# Patient Record
Sex: Female | Born: 1945 | Race: White | Hispanic: No | Marital: Married | Smoking: Never smoker
Health system: Southern US, Community
[De-identification: ages and names within clinical notes are randomized; demographics above are authoritative.]

## PROBLEM LIST (undated history)

## (undated) DIAGNOSIS — K219 Gastro-esophageal reflux disease without esophagitis: Secondary | ICD-10-CM

## (undated) DIAGNOSIS — F419 Anxiety disorder, unspecified: Secondary | ICD-10-CM

## (undated) DIAGNOSIS — E079 Disorder of thyroid, unspecified: Secondary | ICD-10-CM

## (undated) HISTORY — PX: ABDOMINAL HYSTERECTOMY: SHX81

## (undated) HISTORY — PX: DILATION AND CURETTAGE OF UTERUS: SHX78

## (undated) HISTORY — DX: Disorder of thyroid, unspecified: E07.9

## (undated) HISTORY — PX: BLADDER REPAIR: SHX76

---

## 2006-11-08 ENCOUNTER — Ambulatory Visit (HOSPITAL_COMMUNITY): Admission: RE | Admit: 2006-11-08 | Discharge: 2006-11-08 | Payer: Self-pay | Admitting: Family Medicine

## 2006-11-09 ENCOUNTER — Ambulatory Visit (HOSPITAL_COMMUNITY): Admission: RE | Admit: 2006-11-09 | Discharge: 2006-11-09 | Payer: Self-pay | Admitting: Family Medicine

## 2007-01-24 ENCOUNTER — Ambulatory Visit: Payer: Self-pay | Admitting: Orthopedic Surgery

## 2008-04-20 ENCOUNTER — Emergency Department (HOSPITAL_COMMUNITY): Admission: EM | Admit: 2008-04-20 | Discharge: 2008-04-20 | Payer: Self-pay | Admitting: Emergency Medicine

## 2008-04-30 ENCOUNTER — Ambulatory Visit: Payer: Self-pay | Admitting: Cardiology

## 2008-05-01 ENCOUNTER — Ambulatory Visit: Payer: Self-pay | Admitting: Cardiovascular Disease

## 2008-05-01 ENCOUNTER — Ambulatory Visit (HOSPITAL_COMMUNITY): Admission: RE | Admit: 2008-05-01 | Discharge: 2008-05-01 | Payer: Self-pay | Admitting: Cardiovascular Disease

## 2008-05-04 ENCOUNTER — Ambulatory Visit: Payer: Self-pay | Admitting: Orthopedic Surgery

## 2008-05-04 DIAGNOSIS — M7580 Other shoulder lesions, unspecified shoulder: Secondary | ICD-10-CM

## 2008-05-04 DIAGNOSIS — M25519 Pain in unspecified shoulder: Secondary | ICD-10-CM | POA: Insufficient documentation

## 2008-05-04 DIAGNOSIS — M25819 Other specified joint disorders, unspecified shoulder: Secondary | ICD-10-CM | POA: Insufficient documentation

## 2008-05-14 ENCOUNTER — Ambulatory Visit: Payer: Self-pay | Admitting: Cardiology

## 2008-06-16 ENCOUNTER — Telehealth: Payer: Self-pay | Admitting: Orthopedic Surgery

## 2008-06-24 ENCOUNTER — Ambulatory Visit: Payer: Self-pay | Admitting: Orthopedic Surgery

## 2008-06-24 DIAGNOSIS — M542 Cervicalgia: Secondary | ICD-10-CM | POA: Insufficient documentation

## 2008-06-26 ENCOUNTER — Encounter (INDEPENDENT_AMBULATORY_CARE_PROVIDER_SITE_OTHER): Payer: Self-pay | Admitting: *Deleted

## 2008-07-02 ENCOUNTER — Telehealth: Payer: Self-pay | Admitting: Orthopedic Surgery

## 2008-07-03 ENCOUNTER — Ambulatory Visit (HOSPITAL_COMMUNITY): Admission: RE | Admit: 2008-07-03 | Discharge: 2008-07-03 | Payer: Self-pay | Admitting: Orthopedic Surgery

## 2008-07-07 ENCOUNTER — Encounter (HOSPITAL_COMMUNITY): Admission: RE | Admit: 2008-07-07 | Discharge: 2008-08-06 | Payer: Self-pay | Admitting: Orthopedic Surgery

## 2008-07-08 ENCOUNTER — Ambulatory Visit: Payer: Self-pay | Admitting: Orthopedic Surgery

## 2008-07-21 ENCOUNTER — Encounter: Payer: Self-pay | Admitting: Orthopedic Surgery

## 2008-07-31 ENCOUNTER — Telehealth: Payer: Self-pay | Admitting: Orthopedic Surgery

## 2008-08-11 ENCOUNTER — Telehealth: Payer: Self-pay | Admitting: Orthopedic Surgery

## 2008-08-26 ENCOUNTER — Encounter: Payer: Self-pay | Admitting: Orthopedic Surgery

## 2008-09-17 ENCOUNTER — Encounter: Payer: Self-pay | Admitting: Orthopedic Surgery

## 2008-12-15 ENCOUNTER — Encounter: Payer: Self-pay | Admitting: Obstetrics and Gynecology

## 2008-12-15 ENCOUNTER — Inpatient Hospital Stay (HOSPITAL_COMMUNITY): Admission: RE | Admit: 2008-12-15 | Discharge: 2008-12-16 | Payer: Self-pay | Admitting: Obstetrics and Gynecology

## 2009-04-16 ENCOUNTER — Ambulatory Visit (HOSPITAL_COMMUNITY): Admission: RE | Admit: 2009-04-16 | Discharge: 2009-04-16 | Payer: Self-pay | Admitting: Family Medicine

## 2009-08-04 ENCOUNTER — Ambulatory Visit (HOSPITAL_COMMUNITY): Admission: RE | Admit: 2009-08-04 | Discharge: 2009-08-04 | Payer: Self-pay | Admitting: Family Medicine

## 2009-10-19 ENCOUNTER — Ambulatory Visit (HOSPITAL_COMMUNITY)
Admission: RE | Admit: 2009-10-19 | Discharge: 2009-10-19 | Payer: Self-pay | Source: Home / Self Care | Admitting: Family Medicine

## 2009-10-27 ENCOUNTER — Ambulatory Visit (HOSPITAL_COMMUNITY)
Admission: RE | Admit: 2009-10-27 | Discharge: 2009-10-27 | Payer: Self-pay | Source: Home / Self Care | Admitting: Family Medicine

## 2010-08-03 ENCOUNTER — Ambulatory Visit (HOSPITAL_COMMUNITY)
Admission: RE | Admit: 2010-08-03 | Discharge: 2010-08-03 | Payer: Self-pay | Source: Home / Self Care | Attending: Internal Medicine | Admitting: Internal Medicine

## 2010-08-03 ENCOUNTER — Ambulatory Visit: Payer: Self-pay | Admitting: Internal Medicine

## 2010-09-04 ENCOUNTER — Encounter: Payer: Self-pay | Admitting: Family Medicine

## 2010-11-22 LAB — BASIC METABOLIC PANEL
BUN: 7 mg/dL (ref 6–23)
CO2: 25 mEq/L (ref 19–32)
CO2: 29 mEq/L (ref 19–32)
Calcium: 8.6 mg/dL (ref 8.4–10.5)
Calcium: 8.6 mg/dL (ref 8.4–10.5)
Creatinine, Ser: 0.67 mg/dL (ref 0.4–1.2)
GFR calc Af Amer: >60 mL/min (ref >60)
GFR calc non Af Amer: >60 mL/min (ref >60)
Glucose, Bld: 112 mg/dL — ABNORMAL HIGH (ref 70–99)
Glucose, Bld: 159 mg/dL — ABNORMAL HIGH (ref 70–99)
Potassium: 4.2 mEq/L (ref 3.5–5.1)

## 2010-11-22 LAB — TYPE AND SCREEN: ABO/RH(D): A POS

## 2010-11-22 LAB — CBC
Hemoglobin: 11.2 g/dL — ABNORMAL LOW (ref 12.0–15.0)
MCV: 88 fL (ref 78.0–100.0)
Platelets: 202 10*3/uL (ref 150–400)
Platelets: 258 10*3/uL (ref 150–400)
RDW: 13.1 % (ref 11.5–15.5)
WBC: 10.5 10*3/uL (ref 4.0–10.5)
WBC: 8.7 10*3/uL (ref 4.0–10.5)

## 2010-11-22 LAB — PROTIME-INR
INR: 1 (ref 0.00–1.49)
Prothrombin Time: 12.8 seconds (ref 11.6–15.2)

## 2010-12-27 NOTE — Assessment & Plan Note (Signed)
[facility]                       [place] [facility]IOLOG[facility] OFFICE NOTE   NAME:Curlin, Dashanti M                          MRN:          [id number]  DATE:04/30/2008                            DOB:          [birth year]    REFERRING PHYSICIAN:  Donna Bernard, M.D.   REFERRING PHYSICIAN:  Donna Bernard, M.D.   REASON FOR VISIT:  Chest pain.   HISTORY OF PRESENT ILLNESS:  Ms. Stetzel is a pleasant 65 year old woman  with a history of mild hypothyroidism diagnosed earlier this year as  well as more recently progressive fatigue with exertion.  She has had no  long-term problems with hypertension or diabetes mellitus.  I do see  that she was evaluated by Dr. Antoine Poche back in 2001 for chest pain and  underwent an exercise treadmill test at that time, demonstrating no  ischemic ST-segment changes.  She has had blood work followed through  her primary care office with documentation of an LDL cholesterol of 137  back in May of this year.  She also has a family history of  cardiovascular disease involving her parents and a brother.  What she  describes is approximately 43-month history of progressive periodic  weakness, typically associated with exertion.  She has noted this with  activities such as walking on a mild uphill grade between her house and  her garage.  More recently, this has become associated with a  tightness in her chest and some discomfort in her left arm.  She has  also had a cramping sensation in the right side of her neck radiating  down into her chest.  She has been managed for hypothyroidism as well  was treated for possible flu with Tamiflu in the meanwhile and has had  no improvement in her symptoms.  She has also experienced some  intermittent nausea with this weakness.  She was seen in the emergency  department at [facility] earlier in the month with normal cardiac  markers and her electrocardiogram from around that time showed sinus  bradycardia at 48 beats per minute, but no acute ST-T wave changes.  Repeat tracing today shows similar findings.   ALLERGIES:  Include AUGMENTIN and CIPRO.   PRESENT MEDICATIONS:  1. Aspirin 81 mg p.o. daily.  2. Oxazepam 15 mg p.o. q.h.s.  3. Zoloft 25 mg p.o. daily.  4. Urispas 100 mg p.o. b.i.d.   PAST MEDICAL HISTORY:  As detailed above.  She also reports having some  shoulder problems and is to see an orthopedic surgeon soon.  She has  also had some bladder problems.  No other major surgeries.  She has  had a screening colonoscopy in the past.   SOCIAL HISTORY:  The patient is married.  She is 2 children.  She works  for the [employer].  Denies any tobacco or alcohol use.  She is  not exercising regularly.   FAMILY HISTORY:  Significant for premature cardiovascular disease as  detailed previously.   REVIEW OF SYSTEMS:  As outlined above.  She wears partial dentures on  the top.  She  does wear glasses to read.  She has had some urinary  frequency, no frank dysuria or incontinence.  No melena or hematochezia,  no other active bleeding problems.  She does have occasional arthritic  pains.  Otherwise, systems are negative.   EXAMINATION:  Blood pressure 128/68 and her rate is 53.  Weight 169  pounds.  The patient is comfortable and in no acute distress.  HEENT:  Conjunctivae are normal.  Pharynx is clear.  NECK:  Supple.  No elevated jugular venous pressure, no loud bruits.  No  thyromegaly is noted.  LUNGS:  Clear without labored breathing at rest.  CARDIOVASCULAR EXAM:  Regular rate and rhythm.  No loud murmur or  gallop.  ABDOMEN:  Soft, nontender.  No active bowel sounds.  EXTREMITIES:  Showed significant pitting edema.  His pulses were 2+.  SKIN:  Warm and dry.  MUSCULOSKELETAL:  No kyphosis noted.  NEUROPSYCHIATRIC:  Patient alert, oriented x3.  Affect is appropriate.   IMPRESSION/RECOMMENDATIONS:  Progressive exertional fatigue with  functional limitation  and more recently associated chest tightness  concerning for possible atypical angina in a 65 year old woman with a  family history of cardiovascular disease and mild elevation in LDL  cholesterol.  She underwent a previous exercise treadmill test in 2001  under the direction of Dr. Antoine Poche which was normal.  She has had no  follow-up testing since that time.  Her resting electrocardiogram is  rather nonspecific showing sinus bradycardia.  Today, we talked about  options including repeat noninvasive testing versus proceeding on to a  diagnostic cardiac catheterization to clearly outline the coronary  anatomy.  We discussed the potential risks and benefits of both  approaches.  After a careful review, she is in agreement to proceed with  a diagnostic cardiac catheterization and this will be arranged as an  outpatient tomorrow in our Cardiac Catheterization lab in [place].  She will continue aspirin and we will repeat baseline lab work.  She had  a recent chest x-ray done with her visit in the emergency department in  early September.  Further plans to follow.     Jonelle Sidle, MD  Electronically Signed    SGM/MedQ  DD: 04/30/2008  DT: 04/30/2008  Job #: [id number]   cc:   Donna Bernard, M.D.

## 2010-12-27 NOTE — Cardiovascular Report (Signed)
NAME:  [name] [name], [name] [name]                   ACCOUNT NO.:  [id number]   MEDICAL RECORD NO.:  [id number]          PATIENT TYPE:  OIB   LOCATION:  2899                         FACILITY:  [facility]   PHYSICIAN:  Verne Carrow, MDDATE OF BIRTH:  [birth year]   DATE OF PROCEDURE:  05/01/2008  DATE OF DISCHARGE:  05/01/2008                            CARDIAC CATHETERIZATION   INDICATIONS:  Fatigue and chest tightness.   PROCEDURES:  1. Left heart catheterization.  2. Selective coronary angiography.  3. Left ventricular angiogram.   OPERATOR:  Verne Carrow, MD   DETAILS OF PROCEDURE:  The patient is an outpatient and signed informed  consent for the heart catheterization prior to coming to the Heart  Catheterization Laboratory.  Her right groin was prepped and draped in a  sterile fashion.  A 5-French sheath was inserted into the right femoral  artery.  Standard diagnostic catheters were used to perform the  selective coronary angiograms of the left coronary system and the right  coronary artery.  A 5-French pigtail catheter was then used across the  aortic valve into the left ventricle.  A left ventricular angiogram was  then performed.  The 5-French pigtail catheter was pulled back across  the aortic valve with no significant pressure gradient measured across  the valve.  The patient tolerated the procedure well and was taken to  the recovery area where her sheath will be removed.  Manual pressure  will be used for hemostasis.  The patient will be required to lay flat  for 3 hours.   ANGIOGRAPHIC FINDINGS:  1. The left main coronary artery is normal and bifurcates into the      circumflex and the left anterior descending.  2. The left anterior descending is a large vessel that courses to the      apex and gives rise to a large diagonal artery.  There is no      angiographic evidence of disease in the LAD system.  3. The circumflex artery gives rise to a large obtuse marginal  branch.      There is no disease in the circumflex system.  4. The right coronary artery is a dominant vessel that gives rise to a      posterolateral branch and a posterior descending branch.  There is      no angiographic evidence of disease in the right coronary artery.  5. A left ventricular angiogram was performed and showed normal      systolic function of the left ventricle with an estimated ejection      fraction of 60%.   HEMODYNAMIC FINDINGS:  Left ventricular pressure 128/9, end-diastolic  pressure 14, and central aortic pressure 130/68.   IMPRESSION:  1. Normal coronary arteries.  2. Noncardiac chest pain.  3. Normal left ventricular systolic function.   RECOMMENDATIONS:  Further workup of noncardiac chest pain per the  patient's primary physician.      Verne Carrow, MD  Electronically Signed     CM/MEDQ  D:  05/01/2008  T:  05/01/2008  Job:  [id number]   cc:  Jonelle Sidle, MD

## 2010-12-27 NOTE — Op Note (Signed)
NAME:  [name], [name]                   ACCOUNT NO.:  [id number]   MEDICAL RECORD NO.:  [id number]          PATIENT TYPE:  INP   LOCATION:                                FACILITY:  [facility]   PHYSICIAN:  Martina Sinner, MD DATE OF BIRTH:  [birth year]   DATE OF PROCEDURE:  DATE OF DISCHARGE:                               OPERATIVE REPORT   PREOPERATIVE DIAGNOSIS:  Uterine vault prolapse with cystocele.   SURGERY:  1. Vault prolapse repair plus cystocele repair plus graft plus      cystoscopy.  2. Transvaginal hysterectomy, primary surgeon Dr. Arline Asp Romine.   [name] [name] has pelvic organ prolapse.   SURGEON:  Martina Sinner, MD   ASSISTANT:  Delia Chimes, NP      [name] [name] has uterine vaginal vault prolapse with cystocele.  [name] [name]  was prepped and draped in usual fashion.  [name] [name] is given preoperative  antibiotics.  Preoperative laboratory tests were normal.  Dr. Tresa Res  placed her in position, did a transvaginal hysterectomy and called me  after [name] [name] had run the posterior cuff.  There were no complications and  [name] [name] was pleased with the surgery.   [name] [name] had an obvious cuff defect with a small cystocele or large  grade 2 cystocele.  [name] [name] had a short anterior vaginal wall.  [name] [name] had a  narrow vagina.  Using a right angle retractor and my Allis clamps, I  injected approximately 23 mL of lidocaine epinephrine mixture  submucosally to help with my dissection.   Using my usual technique,  I made a midline incision and dissected the  anterior vaginal wall from the underlying pubocervical fascia to the  Okimoto line bilaterally.  I gently finger dissected back the ischial  spine bilaterally.  I was careful when I did my two-layer imbrication  with 2-0 Vicryl, i.e. an anterior repair not to distort her anatomy.  I  was happy with the reduction of the cystocele from the anterior repair.  I was careful to sharply take the bladder off the cuff where the  ureterosacrals were tagged by Dr.  Tresa Res.   I then cystoscoped the patient.  There is the typical camel hump  deformity in the midline from reduction of the cystocele.  The ureteral  orifices were near the bladder neck and lateral with good jets  bilaterally.  There is no distortion.  There is no bladder injury.   Using the Capio device I placed a 90 Ethibond  at least 1 cm to 1.5 cm  medial and inferior to the ischial spine after dissecting soft tissues  free from the spines and ligament.  I double-checked the placement of  both sutures.  I was very pleased with their positioned and their  strength and there was no bleeding.   Using my usual technique with a flat malleable and U R 6 needle I placed  0 Vicryl at the urethrovesical angle bilaterally into the pelvic  sidewall.   I then cut a 10 x 6 dermal graft like a trapezoid and sewed it in  place.  It laid in very nicely in the four corners.   I trimmed approximately 2-3 mm of the anterior vaginal wall from the  right and none from the left side.  I closed the anterior vaginal wall  with running 2-0 Vicryl on a CT 1 back to the open cuff.   I then start to run the cuff from front to back.  After I started  closing, I did imbricate the tagged ureteral sacral ligaments left Dr.  Tresa Res.  I was very pleased with a continual and complete closure.   The end of the case, the patient had excellent vaginal length with very  good support anteriorly and no ring effect.  I did a digital rectal  examination.  There was no injury or suture in the rectum.  [name] [name] does  have a mild grade 2 rectocele with elasticity of her tissues but I felt  that [name] [name] did not require a posterior repair.   Vaginal pack with Estrace cream was inserted.  Estimated blood loss was  less than 70 mL.  The operation went very well and hopefully it reaches  her treatment goal.   It is important to add that the patient's bladder neck appeared to be  over corrected.  At the level of the proximal urethra  and bladder neck  her initial to 40 was more like a trap door deformity with a central  defect but there was a fairly acute angle at this point.  I did not  infringe upon this or over imbricate it.  I did not let the graft be  tight in this area.  I do not suspect [name] [name] will have any long-term  voiding flow issues from the surgery.           ______________________________  Martina Sinner, MD  Electronically Signed     SAM/MEDQ  D:  12/15/2008  T:  12/15/2008  Job:  [id number]

## 2010-12-27 NOTE — Assessment & Plan Note (Signed)
[facility]                       [place] [facility] OFFICE NOTE   NAME:[name] [name], [name] [name]                          MRN:          [id number]  DATE:05/14/2008                            DOB:          Nov 26, 1945    PRIMARY CARE PHYSICIAN:  Donna Bernard, MD   REASON FOR VISIT:  Cardiac catheterization followup.   HISTORY OF PRESENT ILLNESS:  I referred [name] [name] for a diagnostic  cardiac catheterization for evaluation of progressive exertional fatigue  with functional limitation and some description of chest tightness in  the setting of a family history of premature cardiovascular disease and  mild hyperlipidemia.  [name] [name] had undergone previous noninvasive testing in  2001, although reported fairly dramatic symptoms recently and opted for  invasive assessment.  Fortunately, [name] [name] cardiac catheterization was  actually very reassuring.  [name] [name] procedure was performed by Dr. Clifton James  on May 01, 2008, and revealed angiographically normal coronary  arteries with normal left ventricular systolic function at 60%.  [name] [name]  tolerated the procedure well and has had no access site problems.  I  reviewed the results with [name] [name] today and while reassured from a cardiac  perspective, [name] [name] continues to be very fatigued and is frustrated by  this.  [name] [name] plans to see Dr. Gerda Diss back to investigate other  possibilities.   ALLERGIES:  AUGMENTIN and CIPRO.   PRESENT MEDICATIONS:  1. Aspirin 81 mg p.o. daily.  2. Oxazepam 15 mg p.o. nightly.  3. Zoloft 50 mg one-half tablet p.o. daily.  4. Urispas 100 mg p.o. b.i.d.   REVIEW OF SYSTEMS:  As described in the history present illness.  I  asked [name] [name] about the possibility of depression, although [name] [name] did  not think that this was the case.  [name] [name] is having some problems with  uterine prolapse.  Otherwise, [name] [name] has good days and bad days.   PHYSICAL EXAMINATION:  VITAL SIGNS:  Blood pressure today 110/68, heart  rate 80, weight  is 170 pounds.  GENERAL:  The patient is comfortable and in no acute distress.  No  significant changes noted compared to baseline.   IMPRESSION AND RECOMMENDATIONS:  Progressive fatigue/weakness with  transient chest tightness, although findings of a very reassuring  cardiac catheterization revealing angiographically normal coronary  arteries and normal left ventricular systolic function.  This is despite  a family history of premature cardiovascular disease and hyperlipidemia.  I have recommended that [name] [name] continue with efforts at risk factor  modification and follow up with Dr.  Gerda Diss to investigate other possible causes of [name] [name] symptomatology.  [name] [name]  cardiac followup can be on an as-needed basis.     Jonelle Sidle, MD  Electronically Signed    SGM/MedQ  DD: 05/14/2008  DT: 05/15/2008  Job #: [id number]   cc:   Donna Bernard, [name].D.

## 2010-12-27 NOTE — Op Note (Signed)
NAME:  Glascoe, Jahnia                   ACCOUNT NO.:  [id number]   MEDICAL RECORD NO.:  [id number]        PATIENT TYPE:  WINP   LOCATION:                                FACILITY:  [facility]   PHYSICIAN:  Cynthia P. Romine, M.D.DATE OF BIRTH:  11-Nov-1945   DATE OF PROCEDURE:  12/15/2008  DATE OF DISCHARGE:  12/16/2008                               OPERATIVE REPORT   PREOPERATIVE DIAGNOSES:  Pelvic relaxation with cystocele and prolapse.   POSTOPERATIVE DIAGNOSES:  Pelvic relaxation with cystocele and prolapse.   PATHOLOGY:  Pending.   PROCEDURE:  Total vaginal hysterectomy (McDiarmid will dictate his part  of surgery, which was cystocele repair and vault suspension separately).   SURGEON:  Cynthia P. Romine, M.D.   ANESTHESIA:  General endotracheal.   ESTIMATED BLOOD LOSS:  50 mL.   COMPLICATIONS:  None.   DESCRIPTION OF PROCEDURE:  The patient was taken to the operating room  and after induction of adequate general endotracheal anesthesia, was  prepped and draped in usual fashion.  A red rubber catheter was used to  drain the bladder.  A posterior weighted and anterior Deaver were  placed.  The cervix was grasped on its anterior lip with a Jacobs  tenaculum.  A 1% Xylocaine with epinephrine was used for a total of  approximately 8 mL to inject around the cervix circumferentially from 9  to 3 o'clock.  Mucosa over the cervix was incised with a knife and  pushed bluntly, cephalad.  Next, a posterior colpotomy incision was made  and the banana retractor placed into the peritoneal space.  Uterosacral  ligaments were clamped with Heaney, cut and tied with 0 Vicryl.  Procedure continued up the cardinal ligament clamping, cutting, and  tying in sequence.  The uterine arteries were clamped and doubly tied  with 0 Vicryl.  The pedicle containing the utero-ovarian ligament, round  ligament, and tubes were clamped, cut and also doubly tied and specimen  was sent to Pathology.  The pedicles were  all inspected and found to be  free of bleeding.  The vaginal cuff was run with a running locking  stitch of 0 Vicryl from 2 o'clock until 10 o'clock and then Dr.  McDiarmid came into the operating suite to begin his part of the  surgery, which is dictated separately.  For the hysterectomy, sponge,  needle, and instrument were counts were correct x3.     Cynthia P. Romine, M.D.  Electronically Signed    CPR/MEDQ  D:  12/23/2008  T:  12/24/2008  Job:  [id number]   cc:   Leighton Roach McDiarmid, M.D.

## 2011-05-17 LAB — POCT CARDIAC MARKERS: Myoglobin, poc: 61.5

## 2011-05-17 LAB — BASIC METABOLIC PANEL
BUN: 13
CO2: 30
Calcium: 9.1
Chloride: 106
Creatinine, Ser: 0.83
GFR calc Af Amer: >60
GFR calc non Af Amer: >60
Potassium: 4.2

## 2011-05-17 LAB — URINALYSIS, ROUTINE W REFLEX MICROSCOPIC
Protein, ur: NEGATIVE
pH: 7

## 2011-05-17 LAB — DIFFERENTIAL
Basophils Absolute: 0
Basophils Relative: 1
Eosinophils Absolute: 0.1
Eosinophils Relative: 2
Monocytes Relative: 6
Neutrophils Relative %: 46

## 2011-05-17 LAB — CBC
MCV: 86.4
RBC: 4.53
RDW: 12.8
WBC: 5.4

## 2011-05-17 LAB — URINE MICROSCOPIC-ADD ON: Urine-Other: NONE SEEN

## 2011-10-18 DIAGNOSIS — R350 Frequency of micturition: Secondary | ICD-10-CM | POA: Diagnosis not present

## 2011-10-18 DIAGNOSIS — J309 Allergic rhinitis, unspecified: Secondary | ICD-10-CM | POA: Diagnosis not present

## 2011-10-18 DIAGNOSIS — E039 Hypothyroidism, unspecified: Secondary | ICD-10-CM | POA: Diagnosis not present

## 2011-10-18 DIAGNOSIS — E782 Mixed hyperlipidemia: Secondary | ICD-10-CM | POA: Diagnosis not present

## 2011-10-18 DIAGNOSIS — G4700 Insomnia, unspecified: Secondary | ICD-10-CM | POA: Diagnosis not present

## 2011-11-07 DIAGNOSIS — H25099 Other age-related incipient cataract, unspecified eye: Secondary | ICD-10-CM | POA: Diagnosis not present

## 2012-02-06 DIAGNOSIS — M25519 Pain in unspecified shoulder: Secondary | ICD-10-CM | POA: Diagnosis not present

## 2012-02-06 DIAGNOSIS — M7500 Adhesive capsulitis of unspecified shoulder: Secondary | ICD-10-CM | POA: Diagnosis not present

## 2012-02-19 DIAGNOSIS — N399 Disorder of urinary system, unspecified: Secondary | ICD-10-CM | POA: Diagnosis not present

## 2012-03-07 DIAGNOSIS — N318 Other neuromuscular dysfunction of bladder: Secondary | ICD-10-CM | POA: Diagnosis not present

## 2012-03-07 DIAGNOSIS — K469 Unspecified abdominal hernia without obstruction or gangrene: Secondary | ICD-10-CM | POA: Diagnosis not present

## 2012-03-07 DIAGNOSIS — N816 Rectocele: Secondary | ICD-10-CM | POA: Diagnosis not present

## 2012-04-18 DIAGNOSIS — N318 Other neuromuscular dysfunction of bladder: Secondary | ICD-10-CM | POA: Diagnosis not present

## 2012-04-22 DIAGNOSIS — J209 Acute bronchitis, unspecified: Secondary | ICD-10-CM | POA: Diagnosis not present

## 2012-05-23 DIAGNOSIS — M7130 Other bursal cyst, unspecified site: Secondary | ICD-10-CM | POA: Diagnosis not present

## 2012-05-29 DIAGNOSIS — M19049 Primary osteoarthritis, unspecified hand: Secondary | ICD-10-CM | POA: Diagnosis not present

## 2012-05-29 DIAGNOSIS — M6740 Ganglion, unspecified site: Secondary | ICD-10-CM | POA: Diagnosis not present

## 2012-06-11 DIAGNOSIS — N3941 Urge incontinence: Secondary | ICD-10-CM | POA: Diagnosis not present

## 2012-06-14 DIAGNOSIS — Z23 Encounter for immunization: Secondary | ICD-10-CM | POA: Diagnosis not present

## 2012-06-15 DIAGNOSIS — J209 Acute bronchitis, unspecified: Secondary | ICD-10-CM | POA: Diagnosis not present

## 2012-06-25 ENCOUNTER — Other Ambulatory Visit: Payer: Self-pay | Admitting: Orthopedic Surgery

## 2012-06-27 NOTE — H&P (Signed)
  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  She has no antecedent history of injury.  It has not ruptured. She seeks an upper extremity orthopaedic consult for evaluation and management.  Her past history is reviewed in detail.  She is 5'5" tall and weighs 170 pounds.  She does not use any prescription pain medication for her symptoms. She is allergic to Augmentin and Cipro.  Current medications are imipramine 25 mg. daily, naproxen 500 mg. daily and ranitidine 15 mg. PRN.  Prior surgery, partial hysterectomy and blader suspension in 2010.   Social history reveals that she is married, she is a nonsmoker, and she does not drink alcoholic beverages.    Family history is detailed and positive for her mother, father and brother all having coronary artery disease.  Her father and sister have high blood pressure.  Her mother and father both have osteoarthritis.    14-point review of systems reveals corrective lenses 1961 to present.   Physical examination reveals a well appearing 66 year-old woman.  Inspection of her hands reveals an obvious mucoid cyst at the dorsoradial aspect of her right index finger DIP joint.  She has full range of motion of her fingers in flexion/extension.  Pulse and cap refill are intact.    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   PLAN:  We have advised her to proceed with excision of her cyst and DIP joint debridement under local anesthesia and sedation.  The surgery and aftercare were described in detail.  [name] Mccowan Dasnoit PA-C H&P documentation: 06/28/2012  -History and Physical Reviewed  -Patient has been re-examined  -No  change in the plan of care  Wyn Forster, MD

## 2012-06-28 ENCOUNTER — Ambulatory Visit (HOSPITAL_BASED_OUTPATIENT_CLINIC_OR_DEPARTMENT_OTHER)
Admission: RE | Admit: 2012-06-28 | Discharge: 2012-06-28 | Disposition: A | Discharge status: Home / Self Care | Payer: Medicare Other | Source: Ambulatory Visit | Attending: Orthopedic Surgery | Admitting: Orthopedic Surgery

## 2012-06-28 ENCOUNTER — Encounter (HOSPITAL_BASED_OUTPATIENT_CLINIC_OR_DEPARTMENT_OTHER)
Admission: RE | Disposition: A | Discharge status: Home / Self Care | Payer: Self-pay | Source: Ambulatory Visit | Attending: Orthopedic Surgery

## 2012-06-28 DIAGNOSIS — M24049 Loose body in unspecified finger joint(s): Secondary | ICD-10-CM | POA: Insufficient documentation

## 2012-06-28 DIAGNOSIS — M24149 Other articular cartilage disorders, unspecified hand: Secondary | ICD-10-CM | POA: Diagnosis not present

## 2012-06-28 DIAGNOSIS — M19049 Primary osteoarthritis, unspecified hand: Secondary | ICD-10-CM | POA: Diagnosis not present

## 2012-06-28 DIAGNOSIS — M6740 Ganglion, unspecified site: Secondary | ICD-10-CM | POA: Insufficient documentation

## 2012-06-28 HISTORY — PX: I & D EXTREMITY: SHX5045

## 2012-06-28 SURGERY — MINOR IRRIGATION AND DEBRIDEMENT EXTREMITY
Anesthesia: LOCAL | Site: Finger | Laterality: Right | Wound class: Clean

## 2012-06-28 MED ORDER — TRAMADOL HCL 50 MG PO TABS: ORAL_TABLET | ORAL | Status: DC

## 2012-06-28 MED ORDER — CHLORHEXIDINE GLUCONATE 4 % EX LIQD: 60.0000 mL | Freq: Once | CUTANEOUS | Status: DC

## 2012-06-28 MED ORDER — LIDOCAINE HCL 2 % IJ SOLN
INTRAMUSCULAR | Status: DC | PRN
  Administered 2012-06-28: 3 mL

## 2012-06-28 MED ORDER — DOXYCYCLINE HYCLATE 100 MG PO TABS: 100.0000 mg | ORAL_TABLET | Freq: Two times a day (BID) | ORAL | Status: DC

## 2012-06-28 SURGICAL SUPPLY — 46 items
BANDAGE ADHESIVE 1X3 (GAUZE/BANDAGES/DRESSINGS) IMPLANT
BLADE SURG 15 STRL LF DISP TIS (BLADE) ×1 IMPLANT
BLADE SURG 15 STRL SS (BLADE) ×2
BNDG CMPR 9X4 STRL LF SNTH (GAUZE/BANDAGES/DRESSINGS)
BNDG CMPR MD 5X2 ELC HKLP STRL (GAUZE/BANDAGES/DRESSINGS) ×1
BNDG COHESIVE 1X5 TAN STRL LF (GAUZE/BANDAGES/DRESSINGS) IMPLANT
BNDG COHESIVE 4X5 TAN STRL (GAUZE/BANDAGES/DRESSINGS) IMPLANT
BNDG ELASTIC 2 VLCR STRL LF (GAUZE/BANDAGES/DRESSINGS) ×2 IMPLANT
BNDG ESMARK 4X9 LF (GAUZE/BANDAGES/DRESSINGS) IMPLANT
BRUSH SCRUB EZ PLAIN DRY (MISCELLANEOUS) ×2 IMPLANT
CLOTH BEACON ORANGE TIMEOUT ST (SAFETY) ×2 IMPLANT
CORDS BIPOLAR (ELECTRODE) IMPLANT
COVER MAYO STAND STRL (DRAPES) ×2 IMPLANT
CUFF TOURNIQUET SINGLE 18IN (TOURNIQUET CUFF) IMPLANT
DECANTER SPIKE VIAL GLASS SM (MISCELLANEOUS) IMPLANT
DRAIN PENROSE 1/2X12 LTX STRL (WOUND CARE) IMPLANT
DRAIN PENROSE 1/4X12 LTX STRL (WOUND CARE) IMPLANT
DRAPE SURG 17X23 STRL (DRAPES) ×2 IMPLANT
DRAPE U 20/CS (DRAPES) IMPLANT
DRAPE U-SHAPE 47X51 STRL (DRAPES) IMPLANT
DRAPE U-SHAPE 76X120 STRL (DRAPES) IMPLANT
DRSG PAD ABDOMINAL 8X10 ST (GAUZE/BANDAGES/DRESSINGS) IMPLANT
GAUZE SPONGE 4X4 12PLY STRL LF (GAUZE/BANDAGES/DRESSINGS) ×4 IMPLANT
GAUZE XEROFORM 1X8 LF (GAUZE/BANDAGES/DRESSINGS) IMPLANT
GLOVE BIO SURGEON STRL SZ 6.5 (GLOVE) ×4 IMPLANT
GLOVE BIOGEL M STRL SZ7.5 (GLOVE) ×2 IMPLANT
GLOVE ORTHO TXT STRL SZ7.5 (GLOVE) ×2 IMPLANT
GOWN PREVENTION PLUS XLARGE (GOWN DISPOSABLE) ×6 IMPLANT
NEEDLE 27GAX1X1/2 (NEEDLE) ×2 IMPLANT
PACK BASIN DAY SURGERY FS (CUSTOM PROCEDURE TRAY) ×2 IMPLANT
PADDING CAST ABS 4INX4YD NS (CAST SUPPLIES) ×1
PADDING CAST ABS COTTON 4X4 ST (CAST SUPPLIES) ×1 IMPLANT
SPONGE GAUZE 4X4 12PLY (GAUZE/BANDAGES/DRESSINGS) ×2 IMPLANT
STOCKINETTE 4X48 STRL (DRAPES) ×2 IMPLANT
STRIP CLOSURE SKIN 1/2X4 (GAUZE/BANDAGES/DRESSINGS) IMPLANT
SUT ETHILON 5 0 P 3 18 (SUTURE) ×1
SUT NYLON ETHILON 5-0 P-3 1X18 (SUTURE) ×1 IMPLANT
SUT PROLENE 3 0 PS 2 (SUTURE) IMPLANT
SWAB CULTURE LIQ STUART DBL (MISCELLANEOUS) IMPLANT
SYR 3ML 23GX1 SAFETY (SYRINGE) IMPLANT
SYR CONTROL 10ML LL (SYRINGE) ×2 IMPLANT
TOWEL OR 17X24 6PK STRL BLUE (TOWEL DISPOSABLE) ×4 IMPLANT
TRAY DSU PREP LF (CUSTOM PROCEDURE TRAY) ×2 IMPLANT
TUBE ANAEROBIC SPECIMEN COL (MISCELLANEOUS) IMPLANT
UNDERPAD 30X30 INCONTINENT (UNDERPADS AND DIAPERS) ×2 IMPLANT
WATER STERILE IRR 1000ML POUR (IV SOLUTION) ×2 IMPLANT

## 2012-06-28 NOTE — Brief Op Note (Signed)
06/28/2012  8:54 AM  PATIENT:  [name] [name]  66 y.o. 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  PROCEDURE:  Procedure(s) (LRB) with comments: MINOR IRRIGATION AND DEBRIDEMENT EXTREMITY (Right) - Debride Mucoid Cyst, Debride Joint   SURGEON:  Surgeon(s) and Role:    * Wyn Forster., MD - Primary  PHYSICIAN ASSISTANT:   ASSISTANTS: Mallory Shirk.A-C   ANESTHESIA:   local  EBL:     BLOOD ADMINISTERED:none  DRAINS: none   LOCAL MEDICATIONS USED:  XYLOCAINE   SPECIMEN:  No Specimen  DISPOSITION OF SPECIMEN:  N/A  COUNTS:  YES  TOURNIQUET:  * Missing tourniquet times found for documented tourniquets in log:  [id number] *  DICTATION: .Other Dictation: Dictation Number [id number]  PLAN OF CARE: Discharge to home after PACU  PATIENT DISPOSITION:  PACU - hemodynamically stable.

## 2012-06-28 NOTE — Op Note (Signed)
[id number] 

## 2012-07-01 ENCOUNTER — Encounter (HOSPITAL_BASED_OUTPATIENT_CLINIC_OR_DEPARTMENT_OTHER): Payer: Self-pay | Admitting: Orthopedic Surgery

## 2012-07-01 ENCOUNTER — Encounter (HOSPITAL_BASED_OUTPATIENT_CLINIC_OR_DEPARTMENT_OTHER): Payer: Self-pay

## 2012-07-01 NOTE — Op Note (Signed)
NAME:  [name] [name], [name] [name]                   ACCOUNT NO.:  [id number]  MEDICAL RECORD NO.:  [id number]  LOCATION:                                 FACILITY:  PHYSICIAN:  [name] [name], M.D. 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  OPERATION: 1. Resection of subcutaneous mucoid cyst, dorsal radial aspect of     right index distal interphalangeal joint. 2. Distal interphalangeal joint arthrotomy with synovectomy and     removal of multiple cartilaginous and osteocartilaginous loose     bodies.  OPERATING SURGEON:  [name] [name], M.D.  ASSISTANT:  [name] Reeks. Dasnoit, [name] [name]  ANESTHESIA:  2% lidocaine, metacarpal head level block of right index finger.  This was performed as a minor operating room procedure.  INDICATIONS:  [name] [name] is a 66 year old woman with history of degenerative arthritis of her small joints.  She developed a significant mucoid cyst on the dorsoradial aspect of her right index finger DIP joint.  She was referred by primary care physicians in [place], [place] for evaluation and management.  We discussed treatment options.  She understands that this is a process that is driven by osteoarthritis.  Debris in the joint causes excess joint fluid and ultimately herniation with mucoid cyst formation.  She understands that there is an art to managing these, which includes removal of the cyst coupled with DIP joint debridement.  The art is  not perfect.  She understands that there is a chance of recurrence, however, typically with a thorough debridement, irrigation, and windowing of the joint.  A mucoid cyst does not typically recur.  We cannot alter the natural history of her arthritis.  After informed consent, she is brought to the operating room at this time.  PROCEDURE:  [name] [name] was interviewed in the holding area and a proper surgical site identified per protocol with a marking pen.  She was escorted to room #1 of the [facility], where in supine position on the operating table, we provided anesthesia and informed consent.  She subsequently had prep of her finger with alcohol, Betadine followed by infiltration of 2% lidocaine metacarpal head level to obtain a digital block.  After 5 minutes, excellent anesthesia was achieved.  The right hand and arm were prepped with Betadine soap and solution, sterilely draped.  After a routine surgical time-out, we exsanguinated the right index finger with a gauze wrap and placed a half-inch Penrose drain over the proximal phalangeal portion of the finger as a digital tourniquet.  Procedure commenced with checking for complete anesthesia.  This was noted by pinching with a fine forceps.  Anesthesia was satisfactory.  We subsequently performed a curvilinear incision incorporating the cyst and allowing exposure of the  dorsoradial aspect of the DIP joint.  The cyst had partially deflated.  We were able to identify the membrane circumferentially dissected and excised.  There was a membrane stalk that led to the radial aspect of the DIP joint.  We then performed a radial arthrotomy excising the capsule between the terminal extensor tendon slip and the radial collateral ligament.  The joint had hypertrophic synovitis.  There were multiple cartilaginous and osteocartilaginous loose bodies noted.  We used a microcurette and a microrongeur to perform synovectomy and used  the curette to remove the marginal osteophytes.  The joint was repeatedly irrigated with sterile saline with a blunt 19- gauge dental needle.  There was considerable osteoarthritis in the DIP joint.  This would place [name] [name] at risk for further recurrence of the cyst in the future, although in our experience of doing this over 30 years, recurrence after thorough debridement is quite unusual.  The wound was then repaired with intradermal 4-0 Prolene.  [name] [name] was placed in the dressing of Xeroflo, sterile gauze, and Coban.  She is provided a prescription for tramadol 50 mg 1 p.o. q.4-6 hours p.r.n. pain, 20 tablets without refill, also doxycycline 100 mg 1 p.o. q.12 hours x4 days as prophylactic antibiotic.     [name] [name], M.D.     RVS/MEDQ  D:  06/28/2012  T:  06/28/2012  Job:  [id number]  cc:   [name] Chute, [name] [name]

## 2012-09-09 DIAGNOSIS — J069 Acute upper respiratory infection, unspecified: Secondary | ICD-10-CM | POA: Diagnosis not present

## 2012-09-09 DIAGNOSIS — J019 Acute sinusitis, unspecified: Secondary | ICD-10-CM | POA: Diagnosis not present

## 2013-05-30 DIAGNOSIS — E569 Vitamin deficiency, unspecified: Secondary | ICD-10-CM | POA: Diagnosis not present

## 2013-05-30 DIAGNOSIS — R5381 Other malaise: Secondary | ICD-10-CM | POA: Diagnosis not present

## 2013-05-30 DIAGNOSIS — E539 Vitamin B deficiency, unspecified: Secondary | ICD-10-CM | POA: Diagnosis not present

## 2013-05-30 DIAGNOSIS — E559 Vitamin D deficiency, unspecified: Secondary | ICD-10-CM | POA: Diagnosis not present

## 2013-05-30 DIAGNOSIS — R197 Diarrhea, unspecified: Secondary | ICD-10-CM | POA: Diagnosis not present

## 2013-05-30 DIAGNOSIS — M25519 Pain in unspecified shoulder: Secondary | ICD-10-CM | POA: Diagnosis not present

## 2013-06-13 DIAGNOSIS — Z23 Encounter for immunization: Secondary | ICD-10-CM | POA: Diagnosis not present

## 2013-07-16 DIAGNOSIS — Z1231 Encounter for screening mammogram for malignant neoplasm of breast: Secondary | ICD-10-CM | POA: Diagnosis not present

## 2013-10-08 DIAGNOSIS — J069 Acute upper respiratory infection, unspecified: Secondary | ICD-10-CM | POA: Diagnosis not present

## 2014-02-14 DIAGNOSIS — K219 Gastro-esophageal reflux disease without esophagitis: Secondary | ICD-10-CM | POA: Diagnosis not present

## 2014-02-14 DIAGNOSIS — E039 Hypothyroidism, unspecified: Secondary | ICD-10-CM | POA: Diagnosis not present

## 2014-02-14 DIAGNOSIS — J209 Acute bronchitis, unspecified: Secondary | ICD-10-CM | POA: Diagnosis not present

## 2014-02-14 DIAGNOSIS — E782 Mixed hyperlipidemia: Secondary | ICD-10-CM | POA: Diagnosis not present

## 2014-02-14 DIAGNOSIS — G4700 Insomnia, unspecified: Secondary | ICD-10-CM | POA: Diagnosis not present

## 2014-02-14 DIAGNOSIS — R05 Cough: Secondary | ICD-10-CM | POA: Diagnosis not present

## 2014-02-14 DIAGNOSIS — R059 Cough, unspecified: Secondary | ICD-10-CM | POA: Diagnosis not present

## 2014-02-20 DIAGNOSIS — R079 Chest pain, unspecified: Secondary | ICD-10-CM | POA: Diagnosis not present

## 2014-02-27 DIAGNOSIS — R5383 Other fatigue: Secondary | ICD-10-CM | POA: Diagnosis not present

## 2014-02-27 DIAGNOSIS — K219 Gastro-esophageal reflux disease without esophagitis: Secondary | ICD-10-CM | POA: Diagnosis not present

## 2014-02-27 DIAGNOSIS — E782 Mixed hyperlipidemia: Secondary | ICD-10-CM | POA: Diagnosis not present

## 2014-02-27 DIAGNOSIS — E039 Hypothyroidism, unspecified: Secondary | ICD-10-CM | POA: Diagnosis not present

## 2014-02-27 DIAGNOSIS — R5381 Other malaise: Secondary | ICD-10-CM | POA: Diagnosis not present

## 2014-03-06 DIAGNOSIS — R05 Cough: Secondary | ICD-10-CM | POA: Diagnosis not present

## 2014-03-06 DIAGNOSIS — K219 Gastro-esophageal reflux disease without esophagitis: Secondary | ICD-10-CM | POA: Diagnosis not present

## 2014-03-06 DIAGNOSIS — G4700 Insomnia, unspecified: Secondary | ICD-10-CM | POA: Diagnosis not present

## 2014-03-06 DIAGNOSIS — E039 Hypothyroidism, unspecified: Secondary | ICD-10-CM | POA: Diagnosis not present

## 2014-03-06 DIAGNOSIS — R059 Cough, unspecified: Secondary | ICD-10-CM | POA: Diagnosis not present

## 2014-03-06 DIAGNOSIS — E782 Mixed hyperlipidemia: Secondary | ICD-10-CM | POA: Diagnosis not present

## 2014-05-28 DIAGNOSIS — E039 Hypothyroidism, unspecified: Secondary | ICD-10-CM | POA: Diagnosis not present

## 2014-05-28 DIAGNOSIS — K219 Gastro-esophageal reflux disease without esophagitis: Secondary | ICD-10-CM | POA: Diagnosis not present

## 2014-05-28 DIAGNOSIS — E782 Mixed hyperlipidemia: Secondary | ICD-10-CM | POA: Diagnosis not present

## 2014-06-22 DIAGNOSIS — Z23 Encounter for immunization: Secondary | ICD-10-CM | POA: Diagnosis not present

## 2014-06-22 DIAGNOSIS — R300 Dysuria: Secondary | ICD-10-CM | POA: Diagnosis not present

## 2014-06-30 DIAGNOSIS — N8110 Cystocele, unspecified: Secondary | ICD-10-CM | POA: Diagnosis not present

## 2014-07-15 ENCOUNTER — Encounter: Payer: Self-pay | Admitting: *Deleted

## 2014-07-16 ENCOUNTER — Telehealth: Payer: Self-pay | Admitting: Obstetrics & Gynecology

## 2014-07-16 ENCOUNTER — Ambulatory Visit (INDEPENDENT_AMBULATORY_CARE_PROVIDER_SITE_OTHER): Payer: Medicare Other | Admitting: Obstetrics & Gynecology

## 2014-07-16 ENCOUNTER — Encounter: Payer: Self-pay | Admitting: Obstetrics & Gynecology

## 2014-07-16 VITALS — BP 130/60 | Ht 65.0 in | Wt 171.0 lb

## 2014-07-16 DIAGNOSIS — IMO0002 Reserved for concepts with insufficient information to code with codable children: Principal | ICD-10-CM

## 2014-07-16 DIAGNOSIS — N8110 Cystocele, unspecified: Secondary | ICD-10-CM

## 2014-07-16 DIAGNOSIS — IMO0001 Reserved for inherently not codable concepts without codable children: Secondary | ICD-10-CM

## 2014-07-16 MED ORDER — ESTRADIOL 0.1 MG/GM VA CREA: 1.0000 | TOPICAL_CREAM | Freq: Every day | VAGINAL | Status: DC

## 2014-07-16 MED ORDER — ESTROGENS, CONJUGATED 0.625 MG/GM VA CREA: 1.0000 | TOPICAL_CREAM | Freq: Every day | VAGINAL | Status: DC

## 2014-07-16 NOTE — Progress Notes (Signed)
Patient ID: [name] [name], female   DOB: Mar 29, 1946, 68 y.o.   MRN: [id number] Chief Complaint  Patient presents with  . Referral    cystocele    HPI Evaluation of increasing pressure and feeling her bladder is dropping Has been present for several months worse recently crampy pressure worse with straining No radiation No associated urine loss  HPI   ROS No burning with urination, frequency or urgency No nausea, vomiting or diarrhea Nor fever chills or other constitutional symptoms  Past Medical History  Diagnosis Date  . Thyroid disease     Past Surgical History  Procedure Laterality Date  . I&d extremity  06/28/2012    Procedure: MINOR IRRIGATION AND DEBRIDEMENT EXTREMITY;  Surgeon: Cammie Sickle., MD;  Location: [place];  Service: Orthopedics;  Laterality: Right;  Debride Mucoid Cyst, Debride Joint     OB History    Gravida Para Term Preterm AB TAB SAB Ectopic Multiple Living   8 2   6  6          Allergies  Allergen Reactions  . Amoxicillin-Pot Clavulanate   . Ciprofloxacin     History   Social History  . Marital Status: Married    Spouse Name: N/A  . Number of Children: N/A  . Years of Education: N/A   Social History Main Topics  . Smoking status: Never Smoker   . Smokeless tobacco: Not on file  . Alcohol Use: Not on file  . Drug Use: Not on file  . Sexual Activity: Not on file   Other Topics Concern  . None   Social History Narrative    Family History  Problem Relation Age of Onset  . Cancer Father      Blood pressure 130/60, height 5\' 5"  (1.651 m), weight 171 lb (77.565 kg).  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

## 2014-07-16 NOTE — Telephone Encounter (Signed)
Pt states her insurance would not cover the Estradiol Dr. Elonda Husky prescribed today. Is there an alternative?

## 2014-07-24 DIAGNOSIS — R1012 Left upper quadrant pain: Secondary | ICD-10-CM | POA: Diagnosis not present

## 2014-07-24 DIAGNOSIS — R05 Cough: Secondary | ICD-10-CM | POA: Diagnosis not present

## 2014-07-27 DIAGNOSIS — R1012 Left upper quadrant pain: Secondary | ICD-10-CM | POA: Diagnosis not present

## 2014-08-03 ENCOUNTER — Encounter: Payer: Self-pay | Admitting: Obstetrics & Gynecology

## 2014-08-19 DIAGNOSIS — J209 Acute bronchitis, unspecified: Secondary | ICD-10-CM | POA: Diagnosis not present

## 2014-08-19 DIAGNOSIS — R05 Cough: Secondary | ICD-10-CM | POA: Diagnosis not present

## 2014-08-27 ENCOUNTER — Ambulatory Visit: Payer: Medicare Other | Admitting: Obstetrics & Gynecology

## 2014-11-24 DIAGNOSIS — R5383 Other fatigue: Secondary | ICD-10-CM | POA: Diagnosis not present

## 2014-11-24 DIAGNOSIS — F328 Other depressive episodes: Secondary | ICD-10-CM | POA: Diagnosis not present

## 2014-11-24 DIAGNOSIS — R739 Hyperglycemia, unspecified: Secondary | ICD-10-CM | POA: Diagnosis not present

## 2014-11-24 DIAGNOSIS — E039 Hypothyroidism, unspecified: Secondary | ICD-10-CM | POA: Diagnosis not present

## 2014-12-08 DIAGNOSIS — M79671 Pain in right foot: Secondary | ICD-10-CM | POA: Diagnosis not present

## 2014-12-08 DIAGNOSIS — L600 Ingrowing nail: Secondary | ICD-10-CM | POA: Diagnosis not present

## 2014-12-08 DIAGNOSIS — L03031 Cellulitis of right toe: Secondary | ICD-10-CM | POA: Diagnosis not present

## 2014-12-08 DIAGNOSIS — L03032 Cellulitis of left toe: Secondary | ICD-10-CM | POA: Diagnosis not present

## 2014-12-25 DIAGNOSIS — F328 Other depressive episodes: Secondary | ICD-10-CM | POA: Diagnosis not present

## 2014-12-25 DIAGNOSIS — E039 Hypothyroidism, unspecified: Secondary | ICD-10-CM | POA: Diagnosis not present

## 2014-12-25 DIAGNOSIS — R5383 Other fatigue: Secondary | ICD-10-CM | POA: Diagnosis not present

## 2015-01-27 DIAGNOSIS — E039 Hypothyroidism, unspecified: Secondary | ICD-10-CM | POA: Diagnosis not present

## 2015-02-04 DIAGNOSIS — M1611 Unilateral primary osteoarthritis, right hip: Secondary | ICD-10-CM | POA: Diagnosis not present

## 2015-02-04 DIAGNOSIS — E039 Hypothyroidism, unspecified: Secondary | ICD-10-CM | POA: Diagnosis not present

## 2015-02-04 DIAGNOSIS — I491 Atrial premature depolarization: Secondary | ICD-10-CM | POA: Diagnosis not present

## 2015-03-01 DIAGNOSIS — M1611 Unilateral primary osteoarthritis, right hip: Secondary | ICD-10-CM | POA: Diagnosis not present

## 2015-03-01 DIAGNOSIS — M25551 Pain in right hip: Secondary | ICD-10-CM | POA: Diagnosis not present

## 2015-03-03 DIAGNOSIS — M25551 Pain in right hip: Secondary | ICD-10-CM | POA: Diagnosis not present

## 2015-03-03 DIAGNOSIS — M1611 Unilateral primary osteoarthritis, right hip: Secondary | ICD-10-CM | POA: Diagnosis not present

## 2015-03-15 DIAGNOSIS — M1611 Unilateral primary osteoarthritis, right hip: Secondary | ICD-10-CM | POA: Diagnosis not present

## 2015-04-05 DIAGNOSIS — M1611 Unilateral primary osteoarthritis, right hip: Secondary | ICD-10-CM | POA: Diagnosis not present

## 2015-04-07 DIAGNOSIS — M1611 Unilateral primary osteoarthritis, right hip: Secondary | ICD-10-CM | POA: Diagnosis not present

## 2015-04-08 DIAGNOSIS — M1611 Unilateral primary osteoarthritis, right hip: Secondary | ICD-10-CM | POA: Diagnosis not present

## 2015-05-05 DIAGNOSIS — R5383 Other fatigue: Secondary | ICD-10-CM | POA: Diagnosis not present

## 2015-05-05 DIAGNOSIS — F328 Other depressive episodes: Secondary | ICD-10-CM | POA: Diagnosis not present

## 2015-05-05 DIAGNOSIS — R001 Bradycardia, unspecified: Secondary | ICD-10-CM | POA: Diagnosis not present

## 2015-05-05 DIAGNOSIS — R030 Elevated blood-pressure reading, without diagnosis of hypertension: Secondary | ICD-10-CM | POA: Diagnosis not present

## 2015-05-05 DIAGNOSIS — E039 Hypothyroidism, unspecified: Secondary | ICD-10-CM | POA: Diagnosis not present

## 2015-05-11 DIAGNOSIS — R001 Bradycardia, unspecified: Secondary | ICD-10-CM | POA: Diagnosis not present

## 2015-05-20 DIAGNOSIS — R001 Bradycardia, unspecified: Secondary | ICD-10-CM | POA: Diagnosis not present

## 2015-05-26 DIAGNOSIS — R001 Bradycardia, unspecified: Secondary | ICD-10-CM | POA: Diagnosis not present

## 2015-05-31 DIAGNOSIS — R001 Bradycardia, unspecified: Secondary | ICD-10-CM | POA: Diagnosis not present

## 2015-06-02 DIAGNOSIS — Z23 Encounter for immunization: Secondary | ICD-10-CM | POA: Diagnosis not present

## 2015-06-03 ENCOUNTER — Inpatient Hospital Stay (HOSPITAL_COMMUNITY)
Admission: EM | Admit: 2015-06-03 | Discharge: 2015-06-05 | DRG: 069 | Disposition: A | Discharge status: Home / Self Care | Payer: Medicare Other | Attending: Internal Medicine | Admitting: Internal Medicine

## 2015-06-03 ENCOUNTER — Emergency Department (HOSPITAL_COMMUNITY): Payer: Medicare Other

## 2015-06-03 ENCOUNTER — Encounter (HOSPITAL_COMMUNITY): Payer: Self-pay | Admitting: *Deleted

## 2015-06-03 DIAGNOSIS — Z79890 Hormone replacement therapy (postmenopausal): Secondary | ICD-10-CM

## 2015-06-03 DIAGNOSIS — E039 Hypothyroidism, unspecified: Secondary | ICD-10-CM | POA: Diagnosis present

## 2015-06-03 DIAGNOSIS — K219 Gastro-esophageal reflux disease without esophagitis: Secondary | ICD-10-CM | POA: Diagnosis present

## 2015-06-03 DIAGNOSIS — F329 Major depressive disorder, single episode, unspecified: Secondary | ICD-10-CM | POA: Diagnosis not present

## 2015-06-03 DIAGNOSIS — G459 Transient cerebral ischemic attack, unspecified: Secondary | ICD-10-CM

## 2015-06-03 DIAGNOSIS — E538 Deficiency of other specified B group vitamins: Secondary | ICD-10-CM | POA: Diagnosis present

## 2015-06-03 DIAGNOSIS — Z23 Encounter for immunization: Secondary | ICD-10-CM

## 2015-06-03 DIAGNOSIS — R001 Bradycardia, unspecified: Secondary | ICD-10-CM | POA: Diagnosis present

## 2015-06-03 DIAGNOSIS — Z880 Allergy status to penicillin: Secondary | ICD-10-CM

## 2015-06-03 DIAGNOSIS — R55 Syncope and collapse: Secondary | ICD-10-CM | POA: Diagnosis not present

## 2015-06-03 DIAGNOSIS — F23 Brief psychotic disorder: Secondary | ICD-10-CM | POA: Diagnosis not present

## 2015-06-03 DIAGNOSIS — Z881 Allergy status to other antibiotic agents status: Secondary | ICD-10-CM

## 2015-06-03 DIAGNOSIS — Z79899 Other long term (current) drug therapy: Secondary | ICD-10-CM

## 2015-06-03 DIAGNOSIS — E785 Hyperlipidemia, unspecified: Secondary | ICD-10-CM | POA: Diagnosis present

## 2015-06-03 MED ORDER — ASPIRIN 81 MG PO CHEW
324.0000 mg | CHEWABLE_TABLET | Freq: Once | ORAL | Status: AC
  Administered 2015-06-04: 324 mg via ORAL
  Filled 2015-06-03: qty 4

## 2015-06-03 NOTE — ED Provider Notes (Signed)
CSN: [id number]     Arrival date & time 06/03/15  2334 History  By signing my name below, I, Starleen Arms, attest that this documentation has been prepared under the direction and in the presence of Delora Fuel, MD. Electronically Signed: Starleen Arms, ED Scribe. 06/03/2015. 11:46 PM.    Chief Complaint  Patient presents with  . Loss of Consciousness   HPI HPI Comments: [name] [name] is a 69 y.o. female who presents to the Emergency Department complaining of a several minute LOC at home this evening.  Per patient, "I was putting my grandchildren to bed and saying the prayer and couldn't get my words out and the next thing I know my son was hollering momma momma."  She denies confusion or difficulties with word-searching upon waking.  The patient reports intermittent chest pressure and mild CP for two days without aggravating or alleviating factors; SOB last night while laying in bed; nausea this morning; and fatigue today.  Per patient, she wore a heart monitor over a 48 hour period two weeks ago due to an irregular rhythm.  She denies unilateral sensation/motor changes, vision changes, palpitations, diaphoresis.    Past Medical History  Diagnosis Date  . Thyroid disease    Past Surgical History  Procedure Laterality Date  . I&d extremity  06/28/2012    Procedure: MINOR IRRIGATION AND DEBRIDEMENT EXTREMITY;  Surgeon: Cammie Sickle., MD;  Location: [place];  Service: Orthopedics;  Laterality: Right;  Debride Mucoid Cyst, Debride Joint    Family History  Problem Relation Age of Onset  . Cancer Father    Social History  Substance Use Topics  . Smoking status: Never Smoker   . Smokeless tobacco: Not on file  . Alcohol Use: Not on file   OB History    Gravida Para Term Preterm AB TAB SAB Ectopic Multiple Living   8 2   6  6         Review of Systems  Constitutional: Negative for diaphoresis.  Eyes: Negative for visual disturbance.  Respiratory: Positive for  shortness of breath.   Cardiovascular: Positive for chest pain. Negative for palpitations.  Gastrointestinal: Positive for nausea.  Neurological: Positive for syncope. Negative for weakness and numbness.  Psychiatric/Behavioral: Negative for confusion.  All other systems reviewed and are negative.     Allergies  Amoxicillin-pot clavulanate and Ciprofloxacin  Home Medications   Prior to Admission medications   Medication Sig Start Date End Date Taking? Authorizing Provider  acetaminophen (TYLENOL) 325 MG tablet Take 650 mg by mouth every 6 (six) hours as needed.    Historical Provider, MD  conjugated estrogens (PREMARIN) vaginal cream Place 1 Applicatorful vaginally daily. 07/16/14   Florian Buff, MD  doxycycline (VIBRA-TABS) 100 MG tablet Take 1 tablet (100 mg total) by mouth 2 (two) times daily. 06/28/12   Robert Dasnoit, PA-C  estradiol (ESTRACE VAGINAL) 0.1 MG/GM vaginal cream Place 1 Applicatorful vaginally at bedtime. 07/16/14   Florian Buff, MD  imipramine (TOFRANIL) 10 MG tablet Take 10 mg by mouth at bedtime.    Historical Provider, MD  levothyroxine (SYNTHROID, LEVOTHROID) 25 MCG tablet Take 25 mcg by mouth daily before breakfast.    Historical Provider, MD  traMADol (ULTRAM) 50 MG tablet 1 tab every 4 hours as needed for pain 06/28/12   Robert Dasnoit, PA-C   There were no vitals taken for this visit. Physical Exam  Constitutional: She is oriented to person, place, and time. She appears well-developed  and well-nourished. No distress.  HENT:  Head: Normocephalic and atraumatic.  Eyes: Conjunctivae and EOM are normal. Pupils are equal, round, and reactive to light.  Fundi were normal.    Neck: Normal range of motion. Neck supple. No JVD present.  No carotid bruit.   Cardiovascular: Normal rate, regular rhythm and normal heart sounds.   No murmur heard. Pulmonary/Chest: Effort normal and breath sounds normal. She has no wheezes. She has no rales. She exhibits no tenderness.   Abdominal: Bowel sounds are normal. She exhibits no distension and no mass. There is no tenderness.  Musculoskeletal: Normal range of motion. She exhibits no edema.  Lymphadenopathy:    She has no cervical adenopathy.  Neurological: She is alert and oriented to person, place, and time. No cranial nerve deficit. She exhibits normal muscle tone. Coordination normal.  No pronator drift. Strength is 5/5 in all tested muscle groups. No sensory deficits.  Skin: Skin is warm and dry. No rash noted.  Psychiatric: She has a normal mood and affect. Her behavior is normal. Judgment and thought content normal.  Nursing note and vitals reviewed.   ED Course  Procedures (including critical care time)  DIAGNOSTIC STUDIES: Oxygen Saturation is 98% on room air, normal by my interpretation.    COORDINATION OF CARE:  11:43 PM Discussed treatment plan with patient at bedside.  Patient acknowledges and agrees with plan.    Labs Review Results for orders placed or performed during the hospital encounter of 06/03/15  Comprehensive metabolic panel  Result Value Ref Range   Sodium 141 135 - 145 mmol/L   Potassium 3.5 3.5 - 5.1 mmol/L   Chloride 107 101 - 111 mmol/L   CO2 26 22 - 32 mmol/L   Glucose, Bld 124 (H) 65 - 99 mg/dL   BUN 17 6 - 20 mg/dL   Creatinine, Ser 0.97 0.44 - 1.00 mg/dL   Calcium 9.0 8.9 - 10.3 mg/dL   Total Protein 7.0 6.5 - 8.1 g/dL   Albumin 3.8 3.5 - 5.0 g/dL   AST 20 15 - 41 U/L   ALT 13 (L) 14 - 54 U/L   Alkaline Phosphatase 62 38 - 126 U/L   Total Bilirubin 0.7 0.3 - 1.2 mg/dL   GFR calc non Af Amer 58 (L) >60 mL/min   GFR calc Af Amer >60 >60 mL/min   Anion gap 8 5 - 15  Troponin I  Result Value Ref Range   Troponin I <0.03 <0.031 ng/mL  CBC with Differential  Result Value Ref Range   WBC 4.2 4.0 - 10.5 K/uL   RBC 4.65 3.87 - 5.11 MIL/uL   Hemoglobin 13.2 12.0 - 15.0 g/dL   HCT 40.6 36.0 - 46.0 %   MCV 87.3 78.0 - 100.0 fL   MCH 28.4 26.0 - 34.0 pg   MCHC 32.5  30.0 - 36.0 g/dL   RDW 13.3 11.5 - 15.5 %   Platelets 177 150 - 400 K/uL   Neutrophils Relative % 64 %   Neutro Abs 2.7 1.7 - 7.7 K/uL   Lymphocytes Relative 23 %   Lymphs Abs 1.0 0.7 - 4.0 K/uL   Monocytes Relative 10 %   Monocytes Absolute 0.4 0.1 - 1.0 K/uL   Eosinophils Relative 2 %   Eosinophils Absolute 0.1 0.0 - 0.7 K/uL   Basophils Relative 1 %   Basophils Absolute 0.0 0.0 - 0.1 K/uL  I-Stat CG4 Lactic Acid, ED  Result Value Ref Range   Lactic Acid,  Venous 0.68 0.5 - 2.0 mmol/L   Imaging Review Dg Chest 2 View  06/04/2015  CLINICAL DATA:  Syncope tonight.  Weakness for 1 day. EXAM: CHEST  2 VIEW COMPARISON:  07/25/2010 FINDINGS: Stable hyperinflation. Heart is at the upper limits of normal in size, mediastinal contours are normal. Pulmonary vasculature is normal. No consolidation, pleural effusion, or pneumothorax. No acute osseous abnormalities are seen. IMPRESSION: 1. Stable hyperinflation. 2. Borderline cardiomegaly. No congestive failure or localizing process. Electronically Signed   By: Jeb Levering M.D.   On: 06/04/2015 00:23   Ct Head Wo Contrast  06/04/2015  CLINICAL DATA:  Syncope and weakness. EXAM: CT HEAD WITHOUT CONTRAST TECHNIQUE: Contiguous axial images were obtained from the base of the skull through the vertex without intravenous contrast. COMPARISON:  None. FINDINGS: No intracranial hemorrhage, mass effect, or midline shift. No hydrocephalus. The basilar cisterns are patent. No evidence of territorial infarct. No intracranial fluid collection. Normal for age chronic small vessel ischemic change. Calvarium is intact. Included paranasal sinuses and mastoid air cells are well aerated. IMPRESSION: No acute intracranial abnormality. Electronically Signed   By: Jeb Levering M.D.   On: 06/04/2015 00:25   I have personally reviewed and evaluated these images and lab results as part of my medical decision-making.   EKG Interpretation   Date/Time:  Thursday  June 03 2015 23:46:58 EDT Ventricular Rate:  54 PR Interval:  188 QRS Duration: 77 QT Interval:  463 QTC Calculation: 439 R Axis:   62 Text Interpretation:  Sinus rhythm Abnormal R-wave progression, early  transition Otherwise within normal limits When compared with ECG of  12/14/2008, No significant change was found Confirmed by [facility]  MD, Lionardo Haze  ([id number]) on 06/03/2015 11:57:29 PM      MDM   Final diagnoses:  Syncope, unspecified syncope type    Syncopal episode. Brief period of expressive dysphasia preceding syncope does suggest possibility of transient ischemic attack. Patient had malaise throughout the day of uncertain significance. Laboratory workup is unremarkable. Head CT is normal as is chest x-ray. Case is discussed with Dr. Darrick Meigs of [facility] agrees to admit the patient.  I, Dayshon Roback, personally performed the services described in this documentation. All medical record entries made by the scribe were at my direction and in my presence.  I have reviewed the chart and discharge instructions and agree that the record reflects my personal performance and is accurate and complete. Laren Whaling.  87/57/9728. 1:15 AM.      Delora Fuel, MD 20/60/15 [id number]

## 2015-06-03 NOTE — ED Notes (Signed)
Pt brought in by [facility] for c/o syncopal episode; pt states she was saying prayers with a grandchild when family members state she passed out for a few minutes; pt states she has not felt well all day and has had a frontal headache; pt states she had her flu shot yesterday

## 2015-06-04 ENCOUNTER — Observation Stay (HOSPITAL_COMMUNITY): Payer: Medicare Other

## 2015-06-04 ENCOUNTER — Observation Stay (HOSPITAL_BASED_OUTPATIENT_CLINIC_OR_DEPARTMENT_OTHER): Payer: Medicare Other

## 2015-06-04 DIAGNOSIS — R001 Bradycardia, unspecified: Secondary | ICD-10-CM | POA: Diagnosis present

## 2015-06-04 DIAGNOSIS — R55 Syncope and collapse: Secondary | ICD-10-CM

## 2015-06-04 DIAGNOSIS — R531 Weakness: Secondary | ICD-10-CM | POA: Diagnosis not present

## 2015-06-04 DIAGNOSIS — Z79899 Other long term (current) drug therapy: Secondary | ICD-10-CM | POA: Diagnosis not present

## 2015-06-04 DIAGNOSIS — G459 Transient cerebral ischemic attack, unspecified: Principal | ICD-10-CM

## 2015-06-04 DIAGNOSIS — K219 Gastro-esophageal reflux disease without esophagitis: Secondary | ICD-10-CM | POA: Diagnosis present

## 2015-06-04 DIAGNOSIS — Z23 Encounter for immunization: Secondary | ICD-10-CM | POA: Diagnosis not present

## 2015-06-04 DIAGNOSIS — Z880 Allergy status to penicillin: Secondary | ICD-10-CM | POA: Diagnosis not present

## 2015-06-04 DIAGNOSIS — Z79890 Hormone replacement therapy (postmenopausal): Secondary | ICD-10-CM | POA: Diagnosis not present

## 2015-06-04 DIAGNOSIS — Z881 Allergy status to other antibiotic agents status: Secondary | ICD-10-CM | POA: Diagnosis not present

## 2015-06-04 DIAGNOSIS — F329 Major depressive disorder, single episode, unspecified: Secondary | ICD-10-CM | POA: Diagnosis present

## 2015-06-04 DIAGNOSIS — E785 Hyperlipidemia, unspecified: Secondary | ICD-10-CM | POA: Diagnosis present

## 2015-06-04 DIAGNOSIS — E039 Hypothyroidism, unspecified: Secondary | ICD-10-CM | POA: Diagnosis not present

## 2015-06-04 DIAGNOSIS — E538 Deficiency of other specified B group vitamins: Secondary | ICD-10-CM | POA: Diagnosis present

## 2015-06-04 DIAGNOSIS — R030 Elevated blood-pressure reading, without diagnosis of hypertension: Secondary | ICD-10-CM | POA: Diagnosis not present

## 2015-06-04 DIAGNOSIS — S0990XA Unspecified injury of head, initial encounter: Secondary | ICD-10-CM | POA: Diagnosis not present

## 2015-06-04 LAB — LIPID PANEL
Cholesterol: 203 mg/dL — ABNORMAL HIGH (ref 0–200)
HDL: 56 mg/dL (ref >40)
LDL Cholesterol: 133 mg/dL — ABNORMAL HIGH (ref 0–99)
Total CHOL/HDL Ratio: 3.6 RATIO
Triglycerides: 68 mg/dL (ref <150)
VLDL: 14 mg/dL (ref 0–40)

## 2015-06-04 LAB — COMPREHENSIVE METABOLIC PANEL
ALT: 13 U/L — ABNORMAL LOW (ref 14–54)
ANION GAP: 8 (ref 5–15)
AST: 20 U/L (ref 15–41)
Albumin: 3.8 g/dL (ref 3.5–5.0)
Alkaline Phosphatase: 62 U/L (ref 38–126)
BUN: 17 mg/dL (ref 6–20)
CALCIUM: 9 mg/dL (ref 8.9–10.3)
CHLORIDE: 107 mmol/L (ref 101–111)
CO2: 26 mmol/L (ref 22–32)
Creatinine, Ser: 0.97 mg/dL (ref 0.44–1.00)
GFR, EST NON AFRICAN AMERICAN: 58 mL/min — AB (ref >60)
Glucose, Bld: 124 mg/dL — ABNORMAL HIGH (ref 65–99)
Potassium: 3.5 mmol/L (ref 3.5–5.1)
SODIUM: 141 mmol/L (ref 135–145)
Total Bilirubin: 0.7 mg/dL (ref 0.3–1.2)
Total Protein: 7 g/dL (ref 6.5–8.1)

## 2015-06-04 LAB — URINALYSIS, ROUTINE W REFLEX MICROSCOPIC
BILIRUBIN URINE: NEGATIVE
GLUCOSE, UA: NEGATIVE mg/dL
NITRITE: NEGATIVE
PH: 6 (ref 5.0–8.0)
PROTEIN: NEGATIVE mg/dL
Specific Gravity, Urine: 1.02 (ref 1.005–1.030)
Urobilinogen, UA: 1 mg/dL (ref 0.0–1.0)

## 2015-06-04 LAB — TROPONIN I
Troponin I: <0.03 ng/mL (ref <0.031)
Troponin I: <0.03 ng/mL (ref <0.031)
Troponin I: <0.03 ng/mL (ref <0.031)

## 2015-06-04 LAB — URINE MICROSCOPIC-ADD ON

## 2015-06-04 LAB — CBC WITH DIFFERENTIAL/PLATELET
Basophils Absolute: 0 10*3/uL (ref 0.0–0.1)
Basophils Relative: 1 %
Eosinophils Absolute: 0.1 10*3/uL (ref 0.0–0.7)
Eosinophils Relative: 2 %
HEMATOCRIT: 40.6 % (ref 36.0–46.0)
HEMOGLOBIN: 13.2 g/dL (ref 12.0–15.0)
LYMPHS ABS: 1 10*3/uL (ref 0.7–4.0)
LYMPHS PCT: 23 %
MCH: 28.4 pg (ref 26.0–34.0)
MCHC: 32.5 g/dL (ref 30.0–36.0)
MCV: 87.3 fL (ref 78.0–100.0)
MONO ABS: 0.4 10*3/uL (ref 0.1–1.0)
MONOS PCT: 10 %
NEUTROS ABS: 2.7 10*3/uL (ref 1.7–7.7)
NEUTROS PCT: 64 %
Platelets: 177 10*3/uL (ref 150–400)
RBC: 4.65 MIL/uL (ref 3.87–5.11)
RDW: 13.3 % (ref 11.5–15.5)
WBC: 4.2 10*3/uL (ref 4.0–10.5)

## 2015-06-04 LAB — TSH: TSH: 6.038 u[IU]/mL — ABNORMAL HIGH (ref 0.350–4.500)

## 2015-06-04 LAB — VITAMIN B12: VITAMIN B 12: 163 pg/mL — AB (ref 180–914)

## 2015-06-04 LAB — T4, FREE: FREE T4: 0.61 ng/dL (ref 0.61–1.12)

## 2015-06-04 LAB — I-STAT CG4 LACTIC ACID, ED: LACTIC ACID, VENOUS: 0.68 mmol/L (ref 0.5–2.0)

## 2015-06-04 MED ORDER — PNEUMOCOCCAL VAC POLYVALENT 25 MCG/0.5ML IJ INJ
0.5000 mL | INJECTION | INTRAMUSCULAR | Status: AC
Start: 2015-06-05 — End: 2015-06-05
  Administered 2015-06-05: 0.5 mL via INTRAMUSCULAR
  Filled 2015-06-04: qty 0.5

## 2015-06-04 MED ORDER — SERTRALINE HCL 50 MG PO TABS
25.0000 mg | ORAL_TABLET | Freq: Every day | ORAL | Status: DC
  Administered 2015-06-04 – 2015-06-05 (×2): 25 mg via ORAL
  Filled 2015-06-04 (×2): qty 1

## 2015-06-04 MED ORDER — ASPIRIN EC 81 MG PO TBEC
81.0000 mg | DELAYED_RELEASE_TABLET | Freq: Every day | ORAL | Status: DC
  Administered 2015-06-04 – 2015-06-05 (×2): 81 mg via ORAL
  Filled 2015-06-04 (×2): qty 1

## 2015-06-04 MED ORDER — LEVOTHYROXINE SODIUM 25 MCG PO TABS
25.0000 ug | ORAL_TABLET | Freq: Every day | ORAL | Status: DC
  Administered 2015-06-04: 25 ug via ORAL
  Filled 2015-06-04: qty 1

## 2015-06-04 MED ORDER — STROKE: EARLY STAGES OF RECOVERY BOOK
Freq: Once | Status: DC
  Filled 2015-06-04: qty 1

## 2015-06-04 MED ORDER — ENOXAPARIN SODIUM 40 MG/0.4ML ~~LOC~~ SOLN
40.0000 mg | SUBCUTANEOUS | Status: DC
  Administered 2015-06-04: 40 mg via SUBCUTANEOUS
  Filled 2015-06-04: qty 0.4

## 2015-06-04 MED ORDER — TRAMADOL HCL 50 MG PO TABS: 50.0000 mg | ORAL_TABLET | Freq: Four times a day (QID) | ORAL | Status: DC | PRN

## 2015-06-04 MED ORDER — ACETAMINOPHEN 325 MG PO TABS: 650.0000 mg | ORAL_TABLET | Freq: Four times a day (QID) | ORAL | Status: DC | PRN

## 2015-06-04 NOTE — Evaluation (Signed)
Physical Therapy Evaluation Patient Details Name: [name] [name] MRN: [id number] DOB: [birth year] Today's Date: 06/04/2015   History of Present Illness  69yo [name] [name] female on observation after episode of syncope at home.   Clinical Impression  Pt is received semirecumbent in bed upon entry, awake, alert, and willing to participate. No acute distress noted. Pt is A&Ox3 and pleasant.Pt reports she has no chief complaint at this time, that all has resolved and she is at baseline. Pt reports zero falls in the last 6 months. 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 Patient presenting at baseline at this time. PT, patient, and family are all in accordance at this time that no skilled services are indicated. PT signing off.        Follow Up Recommendations No PT follow up    Equipment Recommendations  None recommended by PT    Recommendations for Other Services       Precautions / Restrictions Precautions Precautions: None Restrictions Weight Bearing Restrictions: No      Mobility  Bed Mobility Overal bed mobility: Independent                Transfers Overall transfer level: Independent               General transfer comment: 5x Sit-to-stand in 16.5 seconds; limited by chronic R hip stiffness/pain.   Ambulation/Gait Ambulation/Gait assistance: Supervision Ambulation Distance (Feet): 150 Feet Assistive device: None Gait Pattern/deviations: WFL(Within Functional Limits)   Gait velocity interpretation: <1.8 ft/sec, indicative of risk for recurrent falls General Gait Details: SLow, but patient reports she has been in bed all day. She asserts that her gait is very near baseline.   Stairs Stairs: Yes Stairs assistance: Min guard Stair Management: One rail Left Number of Stairs: 10 General stair comments: At baseline.  Wheelchair Mobility    Modified  Rankin (Stroke Patients Only)       Balance Overall balance assessment: Needs assistance (mild LOB with 12 inch forward reach, able to self correcrt. )                                           Pertinent Vitals/Pain Pain Assessment: No/denies pain    Home Living Family/patient expects to be discharged to:: Private residence Living Arrangements: Spouse/significant other Available Help at Discharge: Family Type of Home: Mobile home Home Access: Stairs to enter Entrance Stairs-Rails: Right;Left;Can reach both Entrance Stairs-Number of Steps: 4 Home Layout: One level Home Equipment: Crutches;Cane - single point      Prior Function Level of Independence: Independent               Hand Dominance   Dominant Hand: Right    Extremity/Trunk Assessment   Upper Extremity Assessment: Overall WFL for tasks assessed           Lower Extremity Assessment: Overall WFL for tasks assessed      Cervical / Trunk Assessment: Normal  Communication   Communication: No difficulties  Cognition Arousal/Alertness: Awake/alert Behavior During Therapy: WFL for tasks assessed/performed Overall Cognitive Status: Within Functional Limits for tasks assessed                      General Comments      Exercises  Assessment/Plan    PT Assessment Patent does not need any further PT services  PT Diagnosis     PT Problem List    PT Treatment Interventions     PT Goals (Current goals can be found in the Care Plan section) Acute Rehab PT Goals Patient Stated Goal: D/C home ASAP PT Goal Formulation: All assessment and education complete, DC therapy    Frequency     Barriers to discharge        Co-evaluation               End of Session   Activity Tolerance: Patient tolerated treatment well Patient left: in bed;with family/visitor present Nurse Communication: Mobility status    Functional Assessment Tool Used: Clinical Judgment   Functional Limitation: Mobility: Walking and moving around Mobility: Walking and Moving Around Current Status (G8115): 0 percent impaired, limited or restricted Mobility: Walking and Moving Around Goal Status (B2620): 0 percent impaired, limited or restricted    Time: 1225-1234 PT Time Calculation (min) (ACUTE ONLY): 9 min   Charges:   PT Evaluation $Initial PT Evaluation Tier I: 1 Procedure     PT G Codes:   PT G-Codes **NOT FOR INPATIENT CLASS** Functional Assessment Tool Used: Clinical Judgment  Functional Limitation: Mobility: Walking and moving around Mobility: Walking and Moving Around Current Status (B5597): 0 percent impaired, limited or restricted Mobility: Walking and Moving Around Goal Status (C1638): 0 percent impaired, limited or restricted    [name] [name] C 06/04/2015, 2:24 PM  2:27 PM  Etta Grandchild, PT, DPT [place] License # [id number]

## 2015-06-04 NOTE — Progress Notes (Signed)
Unable to obtain VS and do neuro check at appropriate time of 1010 due to patient being off floor for MRI.  Will obtain on patients return to the floor.

## 2015-06-04 NOTE — Progress Notes (Signed)
Patient seen and examined. Admitted after midnight due to syncope/TIA. Mild bradycardia on admission. Currently w/o focal neurologic deficit. Patient has a recent Holter study w/o significant abnormalities. No on ASA prior to admission. Please referred to H&P written by Dr. Darrick Meigs for further info/details on admission.  Plan: -will monitor her on telemetry -TIA work up (MRI/MRA, carotid dopplers, TSH, B12, lipid panel) -PT/OT -ASA for secondary prevention -most likely home in the next 24 hours after work up is completed   Barton Dubois [phone]

## 2015-06-04 NOTE — Care Management Note (Signed)
Case Management Note  Patient Details  Name: [name] [name] MRN: [id number] Date of Birth: 01-07-1946  Subjective/Objective:                  Pt admitted from home with syncope, TIA/CVA. Pt lives with her husband and will return home at discharge. Pt is independent with ADL's.  Action/Plan: No CM needs noted.  Expected Discharge Date:                  Expected Discharge Plan:  Home/Self Care  In-House Referral:  NA  Discharge planning Services  CM Consult  Post Acute Care Choice:  NA Choice offered to:  NA  DME Arranged:    DME Agency:     HH Arranged:    HH Agency:     Status of Service:  Completed, signed off  Medicare Important Message Given:    Date Medicare IM Given:    Medicare IM give by:    Date Additional Medicare IM Given:    Additional Medicare Important Message give by:     If discussed at [place] of Stay Meetings, dates discussed:    Additional Comments:  Joylene Draft, RN 06/04/2015, 2:01 PM

## 2015-06-04 NOTE — ED Notes (Signed)
Report given to Shaquanda RN on 300 

## 2015-06-04 NOTE — H&P (Signed)
PCP:   Curlene Labrum, MD   Chief Complaint:  [name] [name] out  HPI: 69 year old female who   has a past medical history of Thyroid disease. today presents to the ED after patient had episode of syncope at home. As per patient she was putting her grandchildren to bed and sitting the prayer and suddenly she could not get words out anymore and next thing patient was on the floor. Patient lost consciousness for about 10 seconds and then was back to normal with no focal deficit. In the ED CT head was done which was negative for any and intracranial lesion. Patient says that she was put on heart monitor for 48 hours about 2 weeks ago due to bradycardia. The patient was told that she did not have any significant problem on Holter monitoring. She denies chest pain, no shortness of breath. No nausea vomiting or diarrhea.   Allergies:   Allergies  Allergen Reactions  . Amoxicillin-Pot Clavulanate   . Ciprofloxacin       Past Medical History  Diagnosis Date  . Thyroid disease     Past Surgical History  Procedure Laterality Date  . I&d extremity  06/28/2012    Procedure: MINOR IRRIGATION AND DEBRIDEMENT EXTREMITY;  Surgeon: Cammie Sickle., MD;  Location: [facility];  Service: Orthopedics;  Laterality: Right;  Debride Mucoid Cyst, Debride Joint     Prior to Admission medications   Medication Sig Start Date End Date Taking? Authorizing Provider  sertraline (ZOLOFT) 25 MG tablet Take 25 mg by mouth daily.   Yes Historical Provider, MD  acetaminophen (TYLENOL) 325 MG tablet Take 650 mg by mouth every 6 (six) hours as needed.    Historical Provider, MD  levothyroxine (SYNTHROID, LEVOTHROID) 25 MCG tablet Take 25 mcg by mouth daily before breakfast.    Historical Provider, MD  traMADol (ULTRAM) 50 MG tablet 1 tab every 4 hours as needed for pain 06/28/12   Leverne Humbles, PA-C    Social History:  reports that she has never smoked. She does not have any smokeless tobacco  history on file. Her alcohol and drug histories are not on file.  Family History  Problem Relation Age of Onset  . Cancer Father     Danley Danker Weights   06/03/15 2346  Weight: 79.833 kg (176 lb)    All the positives are listed in BOLD  Review of Systems:  HEENT: Headache, blurred vision, runny nose, sore throat Neck: Hypothyroidism, hyperthyroidism,,lymphadenopathy Chest : Shortness of breath, history of COPD, Asthma Heart : Chest pain, history of coronary arterey disease GI:  Nausea, vomiting, diarrhea, constipation, GERD GU: Dysuria, urgency, frequency of urination, hematuria Neuro: Stroke, seizures, syncope Psych: Depression, anxiety, hallucinations   Physical Exam: Blood pressure 131/76, pulse 51, temperature 98 F (36.7 C), temperature source Oral, resp. rate 20, height 5\' 5"  (1.651 m), weight 79.833 kg (176 lb), SpO2 98 %. Constitutional:   Patient is a well-developed and well-nourished *female in no acute distress and cooperative with exam. Head: Normocephalic and atraumatic Mouth: Mucus membranes moist Eyes: PERRL, EOMI, conjunctivae normal Neck: Supple, No Thyromegaly Cardiovascular: RRR, S1 normal, S2 normal Pulmonary/Chest: CTAB, no wheezes, rales, or rhonchi Abdominal: Soft. Non-tender, non-distended, bowel sounds are normal, no masses, organomegaly, or guarding present.  Neurological: A&O x3, Strength is normal and symmetric bilaterally, cranial nerve II-XII are grossly intact, no focal motor deficit, sensory intact to light touch bilaterally.  Extremities : No Cyanosis, Clubbing or Edema  Labs on Admission:  Basic  Metabolic Panel:  Recent Labs Lab 06/03/15 2350  NA 141  K 3.5  CL 107  CO2 26  GLUCOSE 124*  BUN 17  CREATININE 0.97  CALCIUM 9.0   Liver Function Tests:  Recent Labs Lab 06/03/15 2350  AST 20  ALT 13*  ALKPHOS 62  BILITOT 0.7  PROT 7.0  ALBUMIN 3.8   No results for input(s): LIPASE, AMYLASE in the last 168 hours. No results for  input(s): AMMONIA in the last 168 hours. CBC:  Recent Labs Lab 06/03/15 2350  WBC 4.2  NEUTROABS 2.7  HGB 13.2  HCT 40.6  MCV 87.3  PLT 177   Cardiac Enzymes:  Recent Labs Lab 06/03/15 2350  TROPONINI <0.03    Radiological Exams on Admission: Dg Chest 2 View  06/04/2015  CLINICAL DATA:  Syncope tonight.  Weakness for 1 day. EXAM: CHEST  2 VIEW COMPARISON:  07/25/2010 FINDINGS: Stable hyperinflation. Heart is at the upper limits of normal in size, mediastinal contours are normal. Pulmonary vasculature is normal. No consolidation, pleural effusion, or pneumothorax. No acute osseous abnormalities are seen. IMPRESSION: 1. Stable hyperinflation. 2. Borderline cardiomegaly. No congestive failure or localizing process. Electronically Signed   By: Jeb Levering M.D.   On: 06/04/2015 00:23   Ct Head Wo Contrast  06/04/2015  CLINICAL DATA:  Syncope and weakness. EXAM: CT HEAD WITHOUT CONTRAST TECHNIQUE: Contiguous axial images were obtained from the base of the skull through the vertex without intravenous contrast. COMPARISON:  None. FINDINGS: No intracranial hemorrhage, mass effect, or midline shift. No hydrocephalus. The basilar cisterns are patent. No evidence of territorial infarct. No intracranial fluid collection. Normal for age chronic small vessel ischemic change. Calvarium is intact. Included paranasal sinuses and mastoid air cells are well aerated. IMPRESSION: No acute intracranial abnormality. Electronically Signed   By: Jeb Levering M.D.   On: 06/04/2015 00:25    EKG: Independently reviewed. Normal sinus rhythm   Assessment/Plan Active Problems:   Syncope   TIA (transient ischemic attack)  Syncope Admit the patient, obtain cardiac exams and 3, echocardiogram in a.m. consider cardiology` consultation in a.m.  ? TIA Patient had brief episode of slurred speech before she passed out, did not have any significant focal neurological deficit. Will obtain TIA workup, check  carotid ultrasound, MRI/MRA brain, fasting lipid profile.  Hypothyroidism Continue Synthroid, check TSH  Code status: Full code  Family discussion: Admission, patients condition and plan of care including tests being ordered have been discussed with the patient and her husband bedside** who indicate understanding and agree with the plan and Code Status.   Time Spent on Admission: 60 min  [facility] Pager: [phone] 06/04/2015, 2:07 AM  If 7PM-7AM, please contact night-coverage  [email or web address]  Password [email or web address]

## 2015-06-05 DIAGNOSIS — E039 Hypothyroidism, unspecified: Secondary | ICD-10-CM

## 2015-06-05 DIAGNOSIS — K219 Gastro-esophageal reflux disease without esophagitis: Secondary | ICD-10-CM

## 2015-06-05 DIAGNOSIS — R030 Elevated blood-pressure reading, without diagnosis of hypertension: Secondary | ICD-10-CM | POA: Diagnosis not present

## 2015-06-05 DIAGNOSIS — E538 Deficiency of other specified B group vitamins: Secondary | ICD-10-CM

## 2015-06-05 DIAGNOSIS — G459 Transient cerebral ischemic attack, unspecified: Secondary | ICD-10-CM | POA: Diagnosis not present

## 2015-06-05 DIAGNOSIS — E785 Hyperlipidemia, unspecified: Secondary | ICD-10-CM

## 2015-06-05 DIAGNOSIS — R55 Syncope and collapse: Secondary | ICD-10-CM | POA: Diagnosis not present

## 2015-06-05 LAB — BASIC METABOLIC PANEL
ANION GAP: 5 (ref 5–15)
BUN: 16 mg/dL (ref 6–20)
CALCIUM: 8.9 mg/dL (ref 8.9–10.3)
CO2: 26 mmol/L (ref 22–32)
CREATININE: 0.75 mg/dL (ref 0.44–1.00)
Chloride: 110 mmol/L (ref 101–111)
GFR calc non Af Amer: >60 mL/min (ref >60)
GLUCOSE: 101 mg/dL — AB (ref 65–99)
Potassium: 4.1 mmol/L (ref 3.5–5.1)
SODIUM: 141 mmol/L (ref 135–145)

## 2015-06-05 LAB — CBC
HCT: 40.6 % (ref 36.0–46.0)
Hemoglobin: 13.4 g/dL (ref 12.0–15.0)
MCH: 29.1 pg (ref 26.0–34.0)
MCHC: 33 g/dL (ref 30.0–36.0)
MCV: 88.3 fL (ref 78.0–100.0)
PLATELETS: 193 10*3/uL (ref 150–400)
RBC: 4.6 MIL/uL (ref 3.87–5.11)
RDW: 13.4 % (ref 11.5–15.5)
WBC: 4.5 10*3/uL (ref 4.0–10.5)

## 2015-06-05 LAB — HEMOGLOBIN A1C
HEMOGLOBIN A1C: 5.7 % — AB (ref 4.8–5.6)
MEAN PLASMA GLUCOSE: 117 mg/dL

## 2015-06-05 MED ORDER — LEVOTHYROXINE SODIUM 50 MCG PO TABS: 50.0000 ug | ORAL_TABLET | Freq: Every day | ORAL | Status: DC

## 2015-06-05 MED ORDER — VITAMIN B-12 1000 MCG PO TABS: 1000.0000 ug | ORAL_TABLET | Freq: Every day | ORAL | Status: DC

## 2015-06-05 MED ORDER — PRAVASTATIN SODIUM 40 MG PO TABS: 40.0000 mg | ORAL_TABLET | Freq: Every day | ORAL | Status: DC

## 2015-06-05 MED ORDER — ASPIRIN 81 MG PO TBEC: 81.0000 mg | DELAYED_RELEASE_TABLET | Freq: Every day | ORAL | Status: DC

## 2015-06-05 MED ORDER — TRAMADOL HCL 50 MG PO TABS: 50.0000 mg | ORAL_TABLET | Freq: Four times a day (QID) | ORAL | Status: DC | PRN

## 2015-06-05 MED ORDER — OMEPRAZOLE 20 MG PO CPDR: 20.0000 mg | DELAYED_RELEASE_CAPSULE | Freq: Every day | ORAL | Status: AC

## 2015-06-05 NOTE — Discharge Summary (Signed)
Physician Discharge Summary  [name] [name] RXV:[id number] DOB: [birth year] DOA: 06/03/2015  PCP: Curlene Labrum, MD  Admit date: 06/03/2015 Discharge date: 06/05/2015  Time spent: 40 minutes  Recommendations for Outpatient Follow-up:  1. Reassess BP and initiate tx with antihypertensives agents if needed 2. CMET to follow LFTs and lipid panel in 8 weeks; patient started on pravachol due to elevated LDL (in setting of TIA and hypothyroidism) 3. Reassess thyroid function panel (TSH/Free T4 and T3) in 8 weeks to follow on her levels (synthroid dose adjusted to 50 mcg during this admission) 4. Follow B12 levels in 4 weeks or so, and evaluate response to oral repletion/suplementation   Discharge Diagnoses:  Active Problems:   Syncope   TIA (transient ischemic attack)   Faintness hypothyroidism  Depression GERD Elevated BP HLD B12 deficiency   Discharge Condition: stable and improved. Will discharge home with instruction to follow with PCP in 10 days.  Diet recommendation: heart healthy/low fat diet   Filed Weights   06/03/15 2346 06/04/15 0209  Weight: 79.833 kg (176 lb) 78.699 kg (173 lb 8 oz)    History of present illness:  69 year old female who  has a past medical history of Thyroid disease and depression. today presents to the ED after patient had episode of syncope at home. As per patient she was putting her grandchildren to bed and was sitting doing the prayers when suddenly she could not get words out anymore and next thing patient was on the floor. Patient lost consciousness for about 10 seconds and then was back to normal with no focal deficit. No urine or bowels incontinence reported. In the ED CT head was done which was negative for any and intracranial lesion. Patient says that she was put on heart monitor for 48 hours about 2 weeks prior to admission due to bradycardia. The patient was told that she did not have any significant problem on Holter monitoring. She denies  chest pain, no shortness of breath. No nausea vomiting or diarrhea.  Hospital Course:  1-Syncope/TIA: no further episodes appreciated during hospitalization -patient with neg CT head and MRI/MRA -2-d echo: w/o abnormalities to explain patients symptoms -telemetry with sinus rhythm -carotid dopplers: neg for significant stenosis  -patient started on ASA for secondary prevention  -will follow up with PCP in 10 days  2-hypothyroidism: -patient TSH was elevated and her free T4 was borderline low. Synthroid medication adjusted to 28mcg daily -will need follow up of thyroid function test in 8 weeks  3-HLD: LDL 133 -patient with elevated LDL; given hx of TIA and ongoing hypothyroidism will initiate tx with pravachol -advise to follow heart healthy/low fat diet  4-B12 deficiency: level 163 -patient denies neuropathy  -will initiate repletion/supplementation -close follow up and reassessment to identified need of injectable therapy if levels remains low with oral therapy  5-GERD: will treat with prilosec -lifestyle changes discussed with patient  6-depression: will continue the use of zoloft -no SI or hallucinations during admission   7-elevated BP: no hx of HTN -most likely lab coat/hopsitalization effect -will hold on starting meds to control BP at this point -PCP to reassess BP and if needed to start antihypertensive regimen     Procedures:  See below for x-ray reports (essentially neg MRI/MRA and CT head)  2-D echo: Study Conclusions - Left ventricle: The cavity size was normal. Systolic function was normal. The estimated ejection fraction was in the range of 60% to 65%. Wall motion was normal; there were no regional wall motion  abnormalities. - Atrial septum: No defect or patent foramen ovale was identified.   Carotid US:  IMPRESSION: Minor carotid intimal thickening. No significant ICA stenosis by ultrasound. Patent antegrade vertebral flow  bilaterally.  Consultations:  None   Discharge Exam: Filed Vitals:   06/05/15 0410  BP: 151/79  Pulse: 62  Temp: 97.9 F (36.6 C)  Resp: 18    General: afebrile, denies any further episodes of lightheadedness or syncope, no SOB, no abd pain, no nausea or vomiting  Cardiovascular: RRR, no rubs, no gallops, no murmurs  Respiratory: CTA bilaterally Abd: soft, NT, ND, positive BS Neuro: no focal deficit appreciated  Discharge Instructions   Discharge Instructions    Diet - low sodium heart healthy    Complete by:  As directed      Discharge instructions    Complete by:  As directed   Arrange follow up with PCP in 10 days Take medications as prescribed Please make sure you follow heart healthy/low fat diet Maintain adequate hydration Take your synthroid medication in am (with empty stomach and at least 30-40 minutes away from other medications)          Current Discharge Medication List    START taking these medications   Details  aspirin EC 81 MG EC tablet Take 1 tablet (81 mg total) by mouth daily.    omeprazole (PRILOSEC) 20 MG capsule Take 1 capsule (20 mg total) by mouth daily. Qty: 30 capsule, Refills: 1    pravastatin (PRAVACHOL) 40 MG tablet Take 1 tablet (40 mg total) by mouth daily. Qty: 30 tablet, Refills: 1    vitamin B-12 (CYANOCOBALAMIN) 1000 MCG tablet Take 1 tablet (1,000 mcg total) by mouth daily. Qty: 30 tablet, Refills: 3      CONTINUE these medications which have CHANGED   Details  levothyroxine (SYNTHROID, LEVOTHROID) 50 MCG tablet Take 1 tablet (50 mcg total) by mouth daily before breakfast. Qty: 30 tablet, Refills: 1    traMADol (ULTRAM) 50 MG tablet Take 1 tablet (50 mg total) by mouth every 6 (six) hours as needed.      CONTINUE these medications which have NOT CHANGED   Details  sertraline (ZOLOFT) 25 MG tablet Take 25 mg by mouth daily.    acetaminophen (TYLENOL) 325 MG tablet Take 650 mg by mouth every 6 (six) hours as needed  for mild pain.        Allergies  Allergen Reactions  . Amoxicillin-Pot Clavulanate Hives and Rash  . Ciprofloxacin Rash   Follow-up Information    Follow up with Curlene Labrum, MD. Schedule an appointment as soon as possible for a visit in 10 days.   Contact information:   [place] [id number] [phone]       The results of significant diagnostics from this hospitalization (including imaging, microbiology, ancillary and laboratory) are listed below for reference.    Significant Diagnostic Studies: Dg Chest 2 View  06/04/2015  CLINICAL DATA:  Syncope tonight.  Weakness for 1 day. EXAM: CHEST  2 VIEW COMPARISON:  07/25/2010 FINDINGS: Stable hyperinflation. Heart is at the upper limits of normal in size, mediastinal contours are normal. Pulmonary vasculature is normal. No consolidation, pleural effusion, or pneumothorax. No acute osseous abnormalities are seen. IMPRESSION: 1. Stable hyperinflation. 2. Borderline cardiomegaly. No congestive failure or localizing process. Electronically Signed   By: Jeb Levering M.D.   On: 06/04/2015 00:23   Ct Head Wo Contrast  06/04/2015  CLINICAL DATA:  Syncope and weakness.  EXAM: CT HEAD WITHOUT CONTRAST TECHNIQUE: Contiguous axial images were obtained from the base of the skull through the vertex without intravenous contrast. COMPARISON:  None. FINDINGS: No intracranial hemorrhage, mass effect, or midline shift. No hydrocephalus. The basilar cisterns are patent. No evidence of territorial infarct. No intracranial fluid collection. Normal for age chronic small vessel ischemic change. Calvarium is intact. Included paranasal sinuses and mastoid air cells are well aerated. IMPRESSION: No acute intracranial abnormality. Electronically Signed   By: Jeb Levering M.D.   On: 06/04/2015 00:25   Mr Jodene Nam Head Wo Contrast  06/04/2015  CLINICAL DATA:  Syncopal event with reported head injury. Patient amnestic for event. EXAM: MRI HEAD WITHOUT  CONTRAST MRA HEAD WITHOUT CONTRAST TECHNIQUE: Multiplanar, multiecho pulse sequences of the brain and surrounding structures were obtained without intravenous contrast. Angiographic images of the head were obtained using MRA technique without contrast. COMPARISON:  CT head 06/04/2015. FINDINGS: MRI HEAD FINDINGS No evidence for acute infarction, hemorrhage, mass lesion, hydrocephalus, or extra-axial fluid. Mild cerebral and cerebellar atrophy. Extensive T2 and FLAIR hyperintensity throughout the periventricular and subcortical Staller matter, also involving the pons, nonspecific, likely small vessel disease although demyelinating process not excluded. Vasculitis, chronic infection less favored. Pituitary, pineal, and cerebellar tonsils unremarkable. No upper cervical lesions. Flow voids are maintained throughout the carotid, basilar, and vertebral arteries. There are no areas of chronic hemorrhage. Visualized calvarium, skull base, and upper cervical osseous structures unremarkable. Scalp and extracranial soft tissues, orbits, sinuses, and mastoids show no acute process. MRA HEAD FINDINGS The LEFT internal carotid artery is widely. 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 ICA terminus widely patent. Basilar artery widely patent vertebrals contributing, LEFT greater than RIGHT. No evidence for acute infarction, hemorrhage, mass lesion, hydrocephalus, or extra-axial fluid. No proximal intracranial stenosis or aneurysm. No cerebellar branch occlusion. IMPRESSION: No acute intracranial findings. Extensive Limb matter disease, favored to represent chronic microvascular ischemic change although demyelinating process not completely excluded. 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 Electronically Signed   By: Staci Righter M.D.    On: 06/04/2015 11:40   Mr Brain Wo Contrast  06/04/2015  CLINICAL DATA:  Syncopal event with reported head injury. Patient amnestic for event. EXAM: MRI HEAD WITHOUT CONTRAST MRA HEAD WITHOUT CONTRAST TECHNIQUE: Multiplanar, multiecho pulse sequences of the brain and surrounding structures were obtained without intravenous contrast. Angiographic images of the head were obtained using MRA technique without contrast. COMPARISON:  CT head 06/04/2015. FINDINGS: MRI HEAD FINDINGS No evidence for acute infarction, hemorrhage, mass lesion, hydrocephalus, or extra-axial fluid. Mild cerebral and cerebellar atrophy. Extensive T2 and FLAIR hyperintensity throughout the periventricular and subcortical Chenette matter, also involving the pons, nonspecific, likely small vessel disease although demyelinating process not excluded. Vasculitis, chronic infection less favored. Pituitary, pineal, and cerebellar tonsils unremarkable. No upper cervical lesions. Flow voids are maintained throughout the carotid, basilar, and vertebral arteries. There are no areas of chronic hemorrhage. Visualized calvarium, skull base, and upper cervical osseous structures unremarkable. Scalp and extracranial soft tissues, orbits, sinuses, and mastoids show no acute process. MRA HEAD FINDINGS The LEFT internal carotid artery is widely. 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 ICA terminus widely patent. Basilar artery widely patent vertebrals contributing, LEFT greater than RIGHT. No evidence for acute infarction, hemorrhage, mass lesion, hydrocephalus, or extra-axial  fluid. No proximal intracranial stenosis or aneurysm. No cerebellar branch occlusion. IMPRESSION: No acute intracranial findings. Extensive Petrea matter disease, favored to represent chronic microvascular ischemic change although demyelinating process not completely excluded. Potentially flow reducing  tandem stenoses of the inferior cavernous and superior cavernous RIGHT ICA; no evidence for vertebrobasilar insufficiency. Electronically Signed   By: Staci Righter M.D.   On: 06/04/2015 11:40   US Carotid Bilateral  06/04/2015  CLINICAL DATA:  TIA symptoms, syncopal episode EXAM: BILATERAL CAROTID DUPLEX ULTRASOUND TECHNIQUE: Pearline Cables scale imaging, color Doppler and duplex ultrasound were performed of bilateral carotid and vertebral arteries in the neck. COMPARISON:  None. FINDINGS: Criteria: Quantification of carotid stenosis is based on velocity parameters that correlate the residual internal carotid diameter with NASCET-based stenosis levels, using the diameter of the distal internal carotid lumen as the denominator for stenosis measurement. The following velocity measurements were obtained: RIGHT ICA:  106/41 cm/sec CCA:  88/50 cm/sec SYSTOLIC ICA/CCA RATIO:  1.3 DIASTOLIC ICA/CCA RATIO:  2.4 ECA:  100 cm/sec LEFT ICA:  125/36 cm/sec CCA:  27/74 cm/sec SYSTOLIC ICA/CCA RATIO:  1.3 DIASTOLIC ICA/CCA RATIO:  1.6 ECA:  72 cm/sec RIGHT CAROTID ARTERY: Minor carotid intimal thickening. No significant plaque formation. No hemodynamically significant right ICA stenosis, velocity elevation, or turbulent flow. Degree of narrowing less than 50%. RIGHT VERTEBRAL ARTERY:  Antegrade LEFT CAROTID ARTERY: Similar minor intimal thickening. No significant plaque formation. No hemodynamically significant left ICA stenosis, velocity elevation, or turbulent flow. LEFT VERTEBRAL ARTERY:  Antegrade IMPRESSION: Minor carotid intimal thickening. No significant ICA stenosis by ultrasound. Patent antegrade vertebral flow bilaterally. Electronically Signed   By: Jerilynn Mages.  Shick M.D.   On: 06/04/2015 11:44   Labs: Basic Metabolic Panel:  Recent Labs Lab 06/03/15 2350 06/05/15 0624  NA 141 141  K 3.5 4.1  CL 107 110  CO2 26 26  GLUCOSE 124* 101*  BUN 17 16  CREATININE 0.97 0.75  CALCIUM 9.0 8.9   Liver Function  Tests:  Recent Labs Lab 06/03/15 2350  AST 20  ALT 13*  ALKPHOS 62  BILITOT 0.7  PROT 7.0  ALBUMIN 3.8   CBC:  Recent Labs Lab 06/03/15 2350 06/05/15 0624  WBC 4.2 4.5  NEUTROABS 2.7  --   HGB 13.2 13.4  HCT 40.6 40.6  MCV 87.3 88.3  PLT 177 193   Cardiac Enzymes:  Recent Labs Lab 06/03/15 2350 06/04/15 0219 06/04/15 0752 06/04/15 1358  TROPONINI <0.03 <0.03 <0.03 <0.03    Signed:  Barton Dubois  [facility] 06/05/2015, 9:59 AM

## 2015-06-05 NOTE — Discharge Instructions (Signed)
Aspirin and Your Heart  Aspirin is a medicine that affects the way blood clots. Aspirin can be used to help reduce the risk of blood clots, heart attacks, and other heart-related problems.  SHOULD I TAKE ASPIRIN? Your health care provider will help you determine whether it is safe and beneficial for you to take aspirin daily. Taking aspirin daily may be beneficial if you:  Have had a heart attack or chest pain.  Have undergone open heart surgery such as coronary artery bypass surgery (CABG).  Have had coronary angioplasty.  Have experienced a stroke or transient ischemic attack (TIA).  Have peripheral vascular disease (PVD).  Have chronic heart rhythm problems such as atrial fibrillation. ARE THERE ANY RISKS OF TAKING ASPIRIN DAILY? Daily use of aspirin can increase your risk of side effects. Some of these include:  Bleeding. Bleeding problems can be minor or serious. An example of a minor problem is a cut that does not stop bleeding. An example of a more serious problem is stomach bleeding or bleeding into the brain. Your risk of bleeding is increased if you are also taking non-steroidal anti-inflammatory medicine (NSAIDs).  Increased bruising.  Upset stomach.  An allergic reaction. People who have nasal polyps have an increased risk of developing an aspirin allergy. WHAT ARE SOME GUIDELINES I SHOULD FOLLOW WHEN TAKING ASPIRIN?   Take aspirin only as directed by your health care provider. Make sure you understand how much you should take and what form you should take. The two forms of aspirin are:  Non-enteric-coated. This type of aspirin does not have a coating and is absorbed quickly. Non-enteric-coated aspirin is usually recommended for people with chest pain. This type of aspirin also comes in a chewable form.  Enteric-coated. This type of aspirin has a special coating that releases the medicine very slowly. Enteric-coated aspirin causes less stomach upset than non-enteric-coated  aspirin. This type of aspirin should not be chewed or crushed.  Drink alcohol in moderation. Drinking alcohol increases your risk of bleeding. WHEN SHOULD I SEEK MEDICAL CARE?   You have unusual bleeding or bruising.  You have stomach pain.  You have an allergic reaction. Symptoms of an allergic reaction include:  Hives.  Itchy skin.  Swelling of the lips, tongue, or face.  You have ringing in your ears. WHEN SHOULD I SEEK IMMEDIATE MEDICAL CARE?   Your bowel movements are bloody, dark red, or black in color.  You vomit or cough up blood.  You have blood in your urine.  You cough, wheeze, or feel short of breath. If you have any of the following symptoms, this is an emergency. Do not wait to see if the pain will go away. Get medical help at once. Call your local emergency services (911 in the [place] Do not drive yourself to the hospital.  You have severe chest pain, especially if the pain is crushing or pressure-like and spreads to the arms, back, neck, or jaw.  You have stroke-like symptoms, such as:   Loss of vision.   Difficulty talking.   Numbness or weakness on one side of your body.   Numbness or weakness in your arm or leg.   Not thinking clearly or feeling confused.    This information is not intended to replace advice given to you by your health care provider. Make sure you discuss any questions you have with your health care provider.   Document Released: 07/13/2008 Document Revised: 08/21/2014 Document Reviewed: 11/05/2013 Elsevier Interactive Patient Education 2016 Elsevier  Inc[employer]  Aspirin and Your Heart  Aspirin is a medicine that affects the way blood clots. Aspirin can be used to help reduce the risk of blood clots, heart attacks, and other heart-related problems.  SHOULD I TAKE ASPIRIN? Your health care provider will help you determine whether it is safe and beneficial for you to take aspirin daily. Taking aspirin daily may be beneficial if  you:  Have had a heart attack or chest pain.  Have undergone open heart surgery such as coronary artery bypass surgery (CABG).  Have had coronary angioplasty.  Have experienced a stroke or transient ischemic attack (TIA).  Have peripheral vascular disease (PVD).  Have chronic heart rhythm problems such as atrial fibrillation. ARE THERE ANY RISKS OF TAKING ASPIRIN DAILY? Daily use of aspirin can increase your risk of side effects. Some of these include:  Bleeding. Bleeding problems can be minor or serious. An example of a minor problem is a cut that does not stop bleeding. An example of a more serious problem is stomach bleeding or bleeding into the brain. Your risk of bleeding is increased if you are also taking non-steroidal anti-inflammatory medicine (NSAIDs).  Increased bruising.  Upset stomach.  An allergic reaction. People who have nasal polyps have an increased risk of developing an aspirin allergy. WHAT ARE SOME GUIDELINES I SHOULD FOLLOW WHEN TAKING ASPIRIN?   Take aspirin only as directed by your health care provider. Make sure you understand how much you should take and what form you should take. The two forms of aspirin are:  Non-enteric-coated. This type of aspirin does not have a coating and is absorbed quickly. Non-enteric-coated aspirin is usually recommended for people with chest pain. This type of aspirin also comes in a chewable form.  Enteric-coated. This type of aspirin has a special coating that releases the medicine very slowly. Enteric-coated aspirin causes less stomach upset than non-enteric-coated aspirin. This type of aspirin should not be chewed or crushed.  Drink alcohol in moderation. Drinking alcohol increases your risk of bleeding. WHEN SHOULD I SEEK MEDICAL CARE?   You have unusual bleeding or bruising.  You have stomach pain.  You have an allergic reaction. Symptoms of an allergic reaction include:  Hives.  Itchy skin.  Swelling of the  lips, tongue, or face.  You have ringing in your ears. WHEN SHOULD I SEEK IMMEDIATE MEDICAL CARE?   Your bowel movements are bloody, dark red, or black in color.  You vomit or cough up blood.  You have blood in your urine.  You cough, wheeze, or feel short of breath. If you have any of the following symptoms, this is an emergency. Do not wait to see if the pain will go away. Get medical help at once. Call your local emergency services (911 in the [place] Do not drive yourself to the hospital.  You have severe chest pain, especially if the pain is crushing or pressure-like and spreads to the arms, back, neck, or jaw.  You have stroke-like symptoms, such as:   Loss of vision.   Difficulty talking.   Numbness or weakness on one side of your body.   Numbness or weakness in your arm or leg.   Not thinking clearly or feeling confused.    This information is not intended to replace advice given to you by your health care provider. Make sure you discuss any questions you have with your health care provider.   Document Released: 07/13/2008 Document Revised: 08/21/2014 Document Reviewed: 11/05/2013 Elsevier Interactive Patient  Education 2016 Elsevier Inc. Transient Ischemic Attack A transient ischemic attack (TIA) is a "warning stroke" that causes stroke-like symptoms. Unlike a stroke, a TIA does not cause permanent damage to the brain. The symptoms of a TIA can happen very fast and do not last long. It is important to know the symptoms of a TIA and what to do. This can help prevent a major stroke or death. CAUSES  A TIA is caused by a temporary blockage in an artery in the brain or neck (carotid artery). The blockage does not allow the brain to get the blood supply it needs and can cause different symptoms. The blockage can be caused by either:  A blood clot.  Fatty buildup (plaque) in a neck or brain artery. RISK FACTORS  High blood pressure (hypertension).  High  cholesterol.  Diabetes mellitus.  Heart disease.  The buildup of plaque in the blood vessels (peripheral artery disease or atherosclerosis).  The buildup of plaque in the blood vessels that provide blood and oxygen to the brain (carotid artery stenosis).  An abnormal heart rhythm (atrial fibrillation).  Obesity.  Using any tobacco products, including cigarettes, chewing tobacco, or electronic cigarettes.  Taking oral contraceptives, especially in combination with using tobacco.  Physical inactivity.  A diet high in fats, salt (sodium), and calories.  Excessive alcohol use.  Use of illegal drugs (especially cocaine and methamphetamine).  Being female.  Being African American.  Being over the age of 13 years.  Family history of stroke.  Previous history of blood clots, stroke, TIA, or heart attack.  Sickle cell disease. SIGNS AND SYMPTOMS  TIA symptoms are the same as a stroke but are temporary. These symptoms usually develop suddenly, or may be newly present upon waking from sleep:  Sudden weakness or numbness of the face, arm, or leg, especially on one side of the body.  Sudden trouble walking or difficulty moving arms or legs.  Sudden confusion.  Sudden personality changes.  Trouble speaking (aphasia) or understanding.  Difficulty swallowing.  Sudden trouble seeing in one or both eyes.  Double vision.  Dizziness.  Loss of balance or coordination.  Sudden severe headache with no known cause.  Trouble reading or writing.  Loss of bowel or bladder control.  Loss of consciousness. DIAGNOSIS  Your health care provider may be able to determine the presence or absence of a TIA based on your symptoms, history, and physical exam. CT scan of the brain is usually performed to help identify a TIA. Other tests may include:  Electrocardiography (ECG).  Continuous heart monitoring.  Echocardiography.  Carotid ultrasonography.  MRI.  A scan of the brain  circulation.  Blood tests. TREATMENT  Since the symptoms of TIA are the same as a stroke, it is important to seek treatment as soon as possible. You may need a medicine to dissolve a blood clot (thrombolytic) if that is the cause of the TIA. This medicine cannot be given if too much time has passed. Treatment may also include:   Rest, oxygen, fluids through an IV tube, and medicines to thin the blood (anticoagulants).  Measures will be taken to prevent short-term and long-term complications, including infection from breathing foreign material into the lungs (aspiration pneumonia), blood clots in the legs, and falls.  Procedures to either remove plaque in the carotid arteries or dilate carotid arteries that have narrowed due to plaque. Those procedures are:  Carotid endarterectomy.  Carotid angioplasty and stenting.  Medicines and diet may be used to address  diabetes, high blood pressure, and other underlying risk factors. HOME CARE INSTRUCTIONS   Take medicines only as directed by your health care provider. Follow the directions carefully. Medicines may be used to control risk factors for a stroke. Be sure you understand all your medicine instructions.  You may be told to take aspirin or the anticoagulant warfarin. Warfarin needs to be taken exactly as instructed.  Taking too much or too little warfarin is dangerous. Too much warfarin increases the risk of bleeding. Too little warfarin continues to allow the risk for blood clots. While taking warfarin, you will need to have regular blood tests to measure your blood clotting time. A PT blood test measures how long it takes for blood to clot. Your PT is used to calculate another value called an INR. Your PT and INR help your health care provider to adjust your dose of warfarin. The dose can change for many reasons. It is critically important that you take warfarin exactly as prescribed.  Many foods, especially foods high in vitamin K can  interfere with warfarin and affect the PT and INR. Foods high in vitamin K include spinach, kale, broccoli, cabbage, collard and turnip greens, Brussels sprouts, peas, cauliflower, seaweed, and parsley, as well as beef and pork liver, green tea, and soybean oil. You should eat a consistent amount of foods high in vitamin K. Avoid major changes in your diet, or notify your health care provider before changing your diet. Arrange a visit with a dietitian to answer your questions.  Many medicines can interfere with warfarin and affect the PT and INR. You must tell your health care provider about any and all medicines you take; this includes all vitamins and supplements. Be especially cautious with aspirin and anti-inflammatory medicines. Do not take or discontinue any prescribed or over-the-counter medicine except on the advice of your health care provider or pharmacist.  Warfarin can have side effects, such as excessive bruising or bleeding. You will need to hold pressure over cuts for longer than usual. Your health care provider or pharmacist will discuss other potential side effects.  Avoid sports or activities that may cause injury or bleeding.  Be careful when shaving, flossing your teeth, or handling sharp objects.  Alcohol can change the body's ability to handle warfarin. It is best to avoid alcoholic drinks or consume only very small amounts while taking warfarin. Notify your health care provider if you change your alcohol intake.  Notify your dentist or other health care providers before procedures.  Eat a diet that includes 5 or more servings of fruits and vegetables each day. This may reduce the risk of stroke. Certain diets may be prescribed to address high blood pressure, high cholesterol, diabetes, or obesity.  A diet low in sodium, saturated fat, trans fat, and cholesterol is recommended to manage high blood pressure.  A diet low in saturated fat, trans fat, and cholesterol, and high in  fiber may control cholesterol levels.  A controlled-carbohydrate, controlled-sugar diet is recommended to manage diabetes.  A reduced-calorie diet that is low in sodium, saturated fat, trans fat, and cholesterol is recommended to manage obesity.  Maintain a healthy weight.  Stay physically active. It is recommended that you get at least 30 minutes of activity on most or all days.  Do not use any tobacco products, including cigarettes, chewing tobacco, or electronic cigarettes. If you need help quitting, ask your health care provider.  Limit alcohol intake to no more than 1 drink per day for  nonpregnant women and 2 drinks per day for men. One drink equals 12 ounces of beer, 5 ounces of wine, or 1 ounces of hard liquor.  Do not abuse drugs.  A safe home environment is important to reduce the risk of falls. Your health care provider may arrange for specialists to evaluate your home. Having grab bars in the bedroom and bathroom is often important. Your health care provider may arrange for equipment to be used at home, such as raised toilets and a seat for the shower.  Follow all instructions for follow-up with your health care provider. This is very important. This includes any referrals and lab tests. Proper follow-up can prevent a stroke or another TIA from occurring. PREVENTION  The risk of a TIA can be decreased by appropriately treating high blood pressure, high cholesterol, diabetes, heart disease, and obesity, and by quitting smoking, limiting alcohol, and staying physically active. SEEK MEDICAL CARE IF:  You have personality changes.  You have difficulty swallowing.  You are seeing double.  You have dizziness.  You have a fever. SEEK IMMEDIATE MEDICAL CARE IF:  Any of the following symptoms may represent a serious problem that is an emergency. Do not wait to see if the symptoms will go away. Get medical help right away. Call your local emergency services (911 in [place] Do not  drive yourself to the hospital.  You have sudden weakness or numbness of the face, arm, or leg, especially on one side of the body.  You have sudden trouble walking or difficulty moving arms or legs.  You have sudden confusion.  You have trouble speaking (aphasia) or understanding.  You have sudden trouble seeing in one or both eyes.  You have a loss of balance or coordination.  You have a sudden, severe headache with no known cause.  You have new chest pain or an irregular heartbeat.  You have a partial or total loss of consciousness. MAKE SURE YOU:   Understand these instructions.  Will watch your condition.  Will get help right away if you are not doing well or get worse.   This information is not intended to replace advice given to you by your health care provider. Make sure you discuss any questions you have with your health care provider.   Document Released: 05/10/2005 Document Revised: 08/21/2014 Document Reviewed: 11/05/2013 Elsevier Interactive Patient Education [employer].

## 2015-06-05 NOTE — Progress Notes (Signed)
Patient discharged home, IV removed - WNL. Reviewed meds and instructed to follow up with PCP.  Stable at this time.  No questions, assisted off unit via WC by NT.

## 2015-07-05 DIAGNOSIS — E039 Hypothyroidism, unspecified: Secondary | ICD-10-CM | POA: Diagnosis not present

## 2015-07-05 DIAGNOSIS — E782 Mixed hyperlipidemia: Secondary | ICD-10-CM | POA: Diagnosis not present

## 2015-07-05 DIAGNOSIS — R55 Syncope and collapse: Secondary | ICD-10-CM | POA: Diagnosis not present

## 2015-07-05 DIAGNOSIS — M1611 Unilateral primary osteoarthritis, right hip: Secondary | ICD-10-CM | POA: Diagnosis not present

## 2015-07-05 DIAGNOSIS — I10 Essential (primary) hypertension: Secondary | ICD-10-CM | POA: Diagnosis not present

## 2015-07-05 DIAGNOSIS — R001 Bradycardia, unspecified: Secondary | ICD-10-CM | POA: Diagnosis not present

## 2015-07-05 DIAGNOSIS — K219 Gastro-esophageal reflux disease without esophagitis: Secondary | ICD-10-CM | POA: Diagnosis not present

## 2015-07-05 DIAGNOSIS — R030 Elevated blood-pressure reading, without diagnosis of hypertension: Secondary | ICD-10-CM | POA: Diagnosis not present

## 2015-07-19 ENCOUNTER — Encounter (INDEPENDENT_AMBULATORY_CARE_PROVIDER_SITE_OTHER): Payer: Self-pay | Admitting: *Deleted

## 2015-08-02 DIAGNOSIS — I491 Atrial premature depolarization: Secondary | ICD-10-CM | POA: Diagnosis not present

## 2015-08-02 DIAGNOSIS — R001 Bradycardia, unspecified: Secondary | ICD-10-CM | POA: Diagnosis not present

## 2015-08-02 DIAGNOSIS — E039 Hypothyroidism, unspecified: Secondary | ICD-10-CM | POA: Diagnosis not present

## 2015-08-02 DIAGNOSIS — E782 Mixed hyperlipidemia: Secondary | ICD-10-CM | POA: Diagnosis not present

## 2015-08-02 DIAGNOSIS — K219 Gastro-esophageal reflux disease without esophagitis: Secondary | ICD-10-CM | POA: Diagnosis not present

## 2015-08-02 DIAGNOSIS — F3289 Other specified depressive episodes: Secondary | ICD-10-CM | POA: Diagnosis not present

## 2015-08-02 DIAGNOSIS — I10 Essential (primary) hypertension: Secondary | ICD-10-CM | POA: Diagnosis not present

## 2015-08-11 DIAGNOSIS — I10 Essential (primary) hypertension: Secondary | ICD-10-CM | POA: Diagnosis not present

## 2015-08-11 DIAGNOSIS — R55 Syncope and collapse: Secondary | ICD-10-CM | POA: Diagnosis not present

## 2015-08-11 DIAGNOSIS — Z1389 Encounter for screening for other disorder: Secondary | ICD-10-CM | POA: Diagnosis not present

## 2015-08-11 DIAGNOSIS — E039 Hypothyroidism, unspecified: Secondary | ICD-10-CM | POA: Diagnosis not present

## 2015-08-11 DIAGNOSIS — F330 Major depressive disorder, recurrent, mild: Secondary | ICD-10-CM | POA: Diagnosis not present

## 2015-08-11 DIAGNOSIS — E782 Mixed hyperlipidemia: Secondary | ICD-10-CM | POA: Diagnosis not present

## 2015-08-11 DIAGNOSIS — I491 Atrial premature depolarization: Secondary | ICD-10-CM | POA: Diagnosis not present

## 2015-08-11 DIAGNOSIS — R5383 Other fatigue: Secondary | ICD-10-CM | POA: Diagnosis not present

## 2015-08-11 DIAGNOSIS — K219 Gastro-esophageal reflux disease without esophagitis: Secondary | ICD-10-CM | POA: Diagnosis not present

## 2015-08-11 DIAGNOSIS — M1611 Unilateral primary osteoarthritis, right hip: Secondary | ICD-10-CM | POA: Diagnosis not present

## 2015-11-02 DIAGNOSIS — Z1231 Encounter for screening mammogram for malignant neoplasm of breast: Secondary | ICD-10-CM | POA: Diagnosis not present

## 2015-11-18 DIAGNOSIS — R5383 Other fatigue: Secondary | ICD-10-CM | POA: Diagnosis not present

## 2015-11-18 DIAGNOSIS — I10 Essential (primary) hypertension: Secondary | ICD-10-CM | POA: Diagnosis not present

## 2015-11-18 DIAGNOSIS — J209 Acute bronchitis, unspecified: Secondary | ICD-10-CM | POA: Diagnosis not present

## 2015-11-18 DIAGNOSIS — E782 Mixed hyperlipidemia: Secondary | ICD-10-CM | POA: Diagnosis not present

## 2015-11-18 DIAGNOSIS — F330 Major depressive disorder, recurrent, mild: Secondary | ICD-10-CM | POA: Diagnosis not present

## 2016-02-09 DIAGNOSIS — E559 Vitamin D deficiency, unspecified: Secondary | ICD-10-CM | POA: Diagnosis not present

## 2016-02-09 DIAGNOSIS — R5383 Other fatigue: Secondary | ICD-10-CM | POA: Diagnosis not present

## 2016-02-09 DIAGNOSIS — I10 Essential (primary) hypertension: Secondary | ICD-10-CM | POA: Diagnosis not present

## 2016-02-09 DIAGNOSIS — K219 Gastro-esophageal reflux disease without esophagitis: Secondary | ICD-10-CM | POA: Diagnosis not present

## 2016-02-09 DIAGNOSIS — E782 Mixed hyperlipidemia: Secondary | ICD-10-CM | POA: Diagnosis not present

## 2016-02-09 DIAGNOSIS — E039 Hypothyroidism, unspecified: Secondary | ICD-10-CM | POA: Diagnosis not present

## 2016-02-16 ENCOUNTER — Encounter (INDEPENDENT_AMBULATORY_CARE_PROVIDER_SITE_OTHER): Payer: Self-pay | Admitting: *Deleted

## 2016-02-16 DIAGNOSIS — I10 Essential (primary) hypertension: Secondary | ICD-10-CM | POA: Diagnosis not present

## 2016-02-16 DIAGNOSIS — E039 Hypothyroidism, unspecified: Secondary | ICD-10-CM | POA: Diagnosis not present

## 2016-02-16 DIAGNOSIS — E782 Mixed hyperlipidemia: Secondary | ICD-10-CM | POA: Diagnosis not present

## 2016-02-16 DIAGNOSIS — Z6829 Body mass index (BMI) 29.0-29.9, adult: Secondary | ICD-10-CM | POA: Diagnosis not present

## 2016-02-16 DIAGNOSIS — M1611 Unilateral primary osteoarthritis, right hip: Secondary | ICD-10-CM | POA: Diagnosis not present

## 2016-02-16 DIAGNOSIS — Z0001 Encounter for general adult medical examination with abnormal findings: Secondary | ICD-10-CM | POA: Diagnosis not present

## 2016-02-16 DIAGNOSIS — Z23 Encounter for immunization: Secondary | ICD-10-CM | POA: Diagnosis not present

## 2016-03-20 DIAGNOSIS — J019 Acute sinusitis, unspecified: Secondary | ICD-10-CM | POA: Diagnosis not present

## 2016-03-20 DIAGNOSIS — R51 Headache: Secondary | ICD-10-CM | POA: Diagnosis not present

## 2016-03-20 DIAGNOSIS — Z6829 Body mass index (BMI) 29.0-29.9, adult: Secondary | ICD-10-CM | POA: Diagnosis not present

## 2016-03-21 ENCOUNTER — Encounter (HOSPITAL_COMMUNITY): Payer: Self-pay | Admitting: Emergency Medicine

## 2016-03-21 ENCOUNTER — Emergency Department (HOSPITAL_COMMUNITY): Payer: Medicare Other

## 2016-03-21 ENCOUNTER — Emergency Department (HOSPITAL_COMMUNITY)
Admission: EM | Admit: 2016-03-21 | Discharge: 2016-03-21 | Disposition: A | Discharge status: Home / Self Care | Payer: Medicare Other | Attending: Emergency Medicine | Admitting: Emergency Medicine

## 2016-03-21 DIAGNOSIS — R51 Headache: Secondary | ICD-10-CM | POA: Diagnosis not present

## 2016-03-21 DIAGNOSIS — Z7982 Long term (current) use of aspirin: Secondary | ICD-10-CM | POA: Insufficient documentation

## 2016-03-21 DIAGNOSIS — Z8673 Personal history of transient ischemic attack (TIA), and cerebral infarction without residual deficits: Secondary | ICD-10-CM | POA: Diagnosis not present

## 2016-03-21 DIAGNOSIS — R519 Headache, unspecified: Secondary | ICD-10-CM

## 2016-03-21 LAB — CBC WITH DIFFERENTIAL/PLATELET
Basophils Absolute: 0 10*3/uL (ref 0.0–0.1)
Basophils Relative: 1 %
Eosinophils Absolute: 0.1 10*3/uL (ref 0.0–0.7)
Eosinophils Relative: 1 %
HCT: 42.6 % (ref 36.0–46.0)
Hemoglobin: 13.4 g/dL (ref 12.0–15.0)
Lymphocytes Relative: 33 %
Lymphs Abs: 1.9 10*3/uL (ref 0.7–4.0)
MCH: 28.1 pg (ref 26.0–34.0)
MCHC: 31.5 g/dL (ref 30.0–36.0)
MCV: 89.3 fL (ref 78.0–100.0)
Monocytes Absolute: 0.3 10*3/uL (ref 0.1–1.0)
Monocytes Relative: 5 %
Neutro Abs: 3.4 10*3/uL (ref 1.7–7.7)
Neutrophils Relative %: 60 %
Platelets: 244 10*3/uL (ref 150–400)
RBC: 4.77 MIL/uL (ref 3.87–5.11)
RDW: 12.9 % (ref 11.5–15.5)
WBC: 5.7 10*3/uL (ref 4.0–10.5)

## 2016-03-21 LAB — BASIC METABOLIC PANEL
Anion gap: 6 (ref 5–15)
BUN: 9 mg/dL (ref 6–20)
CO2: 25 mmol/L (ref 22–32)
Calcium: 9.2 mg/dL (ref 8.9–10.3)
Chloride: 105 mmol/L (ref 101–111)
Creatinine, Ser: 0.72 mg/dL (ref 0.44–1.00)
GFR calc Af Amer: >60 mL/min (ref >60)
GFR calc non Af Amer: >60 mL/min (ref >60)
Glucose, Bld: 117 mg/dL — ABNORMAL HIGH (ref 65–99)
Potassium: 3.8 mmol/L (ref 3.5–5.1)
Sodium: 136 mmol/L (ref 135–145)

## 2016-03-21 LAB — SEDIMENTATION RATE: Sed Rate: 18 mm/hr (ref 0–22)

## 2016-03-21 MED ORDER — SODIUM CHLORIDE 0.9 % IV BOLUS (SEPSIS)
500.0000 mL | Freq: Once | INTRAVENOUS | Status: AC
  Administered 2016-03-21: 500 mL via INTRAVENOUS

## 2016-03-21 MED ORDER — HYDROCODONE-ACETAMINOPHEN 5-325 MG PO TABS: 1.0000 | ORAL_TABLET | Freq: Four times a day (QID) | ORAL | 0 refills | Status: DC | PRN

## 2016-03-21 MED ORDER — OXYCODONE-ACETAMINOPHEN 5-325 MG PO TABS
1.0000 | ORAL_TABLET | Freq: Once | ORAL | Status: AC
  Administered 2016-03-21: 1 via ORAL

## 2016-03-21 MED ORDER — OXYCODONE-ACETAMINOPHEN 5-325 MG PO TABS
ORAL_TABLET | ORAL | Status: AC
  Filled 2016-03-21: qty 1

## 2016-03-21 NOTE — Discharge Instructions (Signed)
Return here for any worsening in your condition.  Follow-up with your primary care doctor. °

## 2016-03-21 NOTE — ED Provider Notes (Signed)
[facility] DEPT Provider Note   CSN: [id number] Arrival date & time: 03/21/16  1007  First Provider Contact:  First MD Initiated Contact with Patient 03/21/16 1335        History   Chief Complaint Chief Complaint  Patient presents with  . Headache    left side    HPI [name] [name] is a 70 y.o. female.  HPI Patient presents to the emergency department with left-sided headache.  The patient states that she noticed that she had severe pain Saturday evening, then Sunday.  The ear pain stopped.  She was noticing pain in the left temple region.  The patient states that the pain did not seem to radiate.  She did not notice any eye discomfort or blurred vision.  The patient states she did not take any medications prior to arrival.  Patient states that he does not notice any other symptoms. The patient denies chest pain, shortness of breath,blurred vision, neck pain, fever, cough, weakness, numbness, dizziness, anorexia, edema, abdominal pain, nausea, vomiting, diarrhea, rash, back pain, dysuria, hematemesis, bloody stool, near syncope, or syncope. Past Medical History:  Diagnosis Date  . Thyroid disease     Patient Active Problem List   Diagnosis Date Noted  . Syncope 06/04/2015  . TIA (transient ischemic attack) 06/04/2015  . Faintness   . NECK PAIN 06/24/2008  . SHOULDER PAIN, LEFT 05/04/2008  . IMPINGEMENT SYNDROME 05/04/2008    Past Surgical History:  Procedure Laterality Date  . I&D EXTREMITY  06/28/2012   Procedure: MINOR IRRIGATION AND DEBRIDEMENT EXTREMITY;  Surgeon: Cammie Sickle., MD;  Location: [facility];  Service: Orthopedics;  Laterality: Right;  Debride Mucoid Cyst, Debride Joint     OB History    Gravida Para Term Preterm AB Living   8 2     6      SAB TAB Ectopic Multiple Live Births   6               Home Medications    Prior to Admission medications   Medication Sig Start Date End Date Taking? Authorizing Provider  levothyroxine  (SYNTHROID, LEVOTHROID) 50 MCG tablet Take 1 tablet (50 mcg total) by mouth daily before breakfast. Patient taking differently: Take 75 mcg by mouth daily before breakfast.  06/06/15  Yes Barton Dubois, MD  omeprazole (PRILOSEC) 20 MG capsule Take 1 capsule (20 mg total) by mouth daily. 06/05/15  Yes Barton Dubois, MD  acetaminophen (TYLENOL) 325 MG tablet Take 650 mg by mouth every 6 (six) hours as needed for mild pain.     Historical Provider, MD  aspirin EC 81 MG EC tablet Take 1 tablet (81 mg total) by mouth daily. Patient not taking: Reported on 03/21/2016 06/05/15   Barton Dubois, MD  pravastatin (PRAVACHOL) 40 MG tablet Take 1 tablet (40 mg total) by mouth daily. Patient not taking: Reported on 03/21/2016 06/05/15   Barton Dubois, MD  traMADol (ULTRAM) 50 MG tablet Take 1 tablet (50 mg total) by mouth every 6 (six) hours as needed. Patient not taking: Reported on 03/21/2016 06/05/15   Barton Dubois, MD  vitamin B-12 (CYANOCOBALAMIN) 1000 MCG tablet Take 1 tablet (1,000 mcg total) by mouth daily. Patient not taking: Reported on 03/21/2016 06/05/15   Barton Dubois, MD    Family History Family History  Problem Relation Age of Onset  . Cancer Father     Social History Social History  Substance Use Topics  . Smoking status: Never Smoker  .  Smokeless tobacco: Never Used  . Alcohol use No     Allergies   Amoxicillin-pot clavulanate and Ciprofloxacin   Review of Systems Review of Systems All other systems negative except as documented in the HPI. All pertinent positives and negatives as reviewed in the HPI.  Physical Exam Updated Vital Signs BP 156/77   Pulse (!) 49   Temp 98.4 F (36.9 C) (Oral)   Resp 19   Ht 5\' 5"  (1.651 m)   Wt 79.8 kg   SpO2 100%   BMI 29.29 kg/m   Physical Exam  Constitutional: She is oriented to person, place, and time. She appears well-developed and well-nourished. No distress.  HENT:  Head: Normocephalic and atraumatic.  Mouth/Throat: Oropharynx  is clear and moist.  Eyes: Pupils are equal, round, and reactive to light.  Neck: Normal range of motion. Neck supple.  Cardiovascular: Normal rate, regular rhythm and normal heart sounds.  Exam reveals no gallop and no friction rub.   No murmur heard. Pulmonary/Chest: Effort normal and breath sounds normal. No respiratory distress. She has no wheezes.  Abdominal: Soft. Bowel sounds are normal. She exhibits no distension. There is no tenderness.  Neurological: She is alert and oriented to person, place, and time. She has normal strength. No sensory deficit. She exhibits normal muscle tone. Coordination and gait normal.  Skin: Skin is warm and dry. No rash noted. No erythema.  Psychiatric: She has a normal mood and affect. Her behavior is normal.  Nursing note and vitals reviewed.    ED Treatments / Results  Labs (all labs ordered are listed, but only abnormal results are displayed) Labs Reviewed  BASIC METABOLIC PANEL - Abnormal; Notable for the following:       Result Value   Glucose, Bld 117 (*)    All other components within normal limits  CBC WITH DIFFERENTIAL/PLATELET  SEDIMENTATION RATE    EKG  EKG Interpretation None       Radiology Ct Head Wo Contrast  Result Date: 03/21/2016 CLINICAL DATA:  70 year old female with a history of frontal headache. EXAM: CT HEAD WITHOUT CONTRAST TECHNIQUE: Contiguous axial images were obtained from the base of the skull through the vertex without intravenous contrast. COMPARISON:  MRI 06/04/2015, CT 06/04/2015 FINDINGS: Unremarkable appearance of the calvarium without acute fracture or aggressive lesion. Unremarkable appearance of the scalp soft tissues. Unremarkable appearance of the bilateral orbits. Mastoid air cells are clear. No significant paranasal sinus disease No acute intracranial hemorrhage, midline shift, or mass effect. Gray-Haver differentiation is maintained, without CT evidence of acute ischemia. Unremarkable configuration of  the ventricles. IMPRESSION: No CT evidence of acute intracranial abnormality. Signed, Dulcy Fanny. Earleen Newport, DO [facility] Radiology Electronically Signed   By: Corrie Mckusick D.O.   On: 03/21/2016 13:49    Procedures Procedures (including critical care time)  Medications Ordered in ED Medications  oxyCODONE-acetaminophen (PERCOCET/ROXICET) 5-325 MG per tablet 1 tablet (1 tablet Oral Given 03/21/16 1110)  sodium chloride 0.9 % bolus 500 mL (0 mLs Intravenous Stopped 03/21/16 1514)     Initial Impression / Assessment and Plan / ED Course  I have reviewed the triage vital signs and the nursing notes.  Pertinent labs & imaging results that were available during my care of the patient were reviewed by me and considered in my medical decision making (see chart for details).  Clinical Course    Patient is advised this could be an early shingles type issue.  That has not yet  fully declared itself.  I did advise her to follow-up with her primary care Dr. told to return here for any worsening in her condition.  It seems atypical for a temporal arteritis base of the fact that there is no palpable tenderness and no blurry vision or visual changes at this time.  The sedimentation rate is normal  Final Clinical Impressions(s) / ED Diagnoses   Final diagnoses:  None    New Prescriptions New Prescriptions   No medications on file     Dalia Heading, PA-C 03/21/16 [place], MD 03/21/16 1601

## 2016-03-21 NOTE — ED Notes (Signed)
Pt c/o left sided intermittent chest pain, "stinging" feeling. Non radiating.

## 2016-03-21 NOTE — ED Notes (Signed)
Pt verbalized understanding of d/c instructions and has no further questions. Pt stable and NAD. Pt is pain free and ambulatory on d/c.

## 2016-04-10 ENCOUNTER — Other Ambulatory Visit: Payer: Self-pay

## 2016-05-31 DIAGNOSIS — Z23 Encounter for immunization: Secondary | ICD-10-CM | POA: Diagnosis not present

## 2016-08-16 DIAGNOSIS — Z6829 Body mass index (BMI) 29.0-29.9, adult: Secondary | ICD-10-CM | POA: Diagnosis not present

## 2016-08-16 DIAGNOSIS — E039 Hypothyroidism, unspecified: Secondary | ICD-10-CM | POA: Diagnosis not present

## 2016-08-16 DIAGNOSIS — R05 Cough: Secondary | ICD-10-CM | POA: Diagnosis not present

## 2016-08-16 DIAGNOSIS — R5383 Other fatigue: Secondary | ICD-10-CM | POA: Diagnosis not present

## 2016-08-16 DIAGNOSIS — K219 Gastro-esophageal reflux disease without esophagitis: Secondary | ICD-10-CM | POA: Diagnosis not present

## 2016-08-16 DIAGNOSIS — J069 Acute upper respiratory infection, unspecified: Secondary | ICD-10-CM | POA: Diagnosis not present

## 2016-08-16 DIAGNOSIS — I10 Essential (primary) hypertension: Secondary | ICD-10-CM | POA: Diagnosis not present

## 2016-08-16 DIAGNOSIS — E782 Mixed hyperlipidemia: Secondary | ICD-10-CM | POA: Diagnosis not present

## 2016-08-16 DIAGNOSIS — E559 Vitamin D deficiency, unspecified: Secondary | ICD-10-CM | POA: Diagnosis not present

## 2016-08-23 DIAGNOSIS — D126 Benign neoplasm of colon, unspecified: Secondary | ICD-10-CM | POA: Diagnosis not present

## 2016-08-23 DIAGNOSIS — Z1389 Encounter for screening for other disorder: Secondary | ICD-10-CM | POA: Diagnosis not present

## 2016-08-23 DIAGNOSIS — E782 Mixed hyperlipidemia: Secondary | ICD-10-CM | POA: Diagnosis not present

## 2016-08-23 DIAGNOSIS — I10 Essential (primary) hypertension: Secondary | ICD-10-CM | POA: Diagnosis not present

## 2016-08-23 DIAGNOSIS — E039 Hypothyroidism, unspecified: Secondary | ICD-10-CM | POA: Diagnosis not present

## 2016-08-23 DIAGNOSIS — R05 Cough: Secondary | ICD-10-CM | POA: Diagnosis not present

## 2016-08-23 DIAGNOSIS — K219 Gastro-esophageal reflux disease without esophagitis: Secondary | ICD-10-CM | POA: Diagnosis not present

## 2016-08-23 DIAGNOSIS — Z6829 Body mass index (BMI) 29.0-29.9, adult: Secondary | ICD-10-CM | POA: Diagnosis not present

## 2016-09-19 ENCOUNTER — Encounter: Payer: Self-pay | Admitting: Internal Medicine

## 2017-02-13 DIAGNOSIS — G629 Polyneuropathy, unspecified: Secondary | ICD-10-CM | POA: Diagnosis not present

## 2017-02-13 DIAGNOSIS — E039 Hypothyroidism, unspecified: Secondary | ICD-10-CM | POA: Diagnosis not present

## 2017-02-13 DIAGNOSIS — E538 Deficiency of other specified B group vitamins: Secondary | ICD-10-CM | POA: Diagnosis not present

## 2017-02-13 DIAGNOSIS — Z6828 Body mass index (BMI) 28.0-28.9, adult: Secondary | ICD-10-CM | POA: Diagnosis not present

## 2017-04-27 ENCOUNTER — Encounter (HOSPITAL_COMMUNITY): Payer: Self-pay | Admitting: Emergency Medicine

## 2017-04-27 ENCOUNTER — Emergency Department (HOSPITAL_COMMUNITY)
Admission: EM | Admit: 2017-04-27 | Discharge: 2017-04-27 | Disposition: A | Discharge status: Home / Self Care | Payer: Medicare Other | Attending: Emergency Medicine | Admitting: Emergency Medicine

## 2017-04-27 ENCOUNTER — Emergency Department (HOSPITAL_COMMUNITY): Payer: Medicare Other

## 2017-04-27 DIAGNOSIS — R1030 Lower abdominal pain, unspecified: Secondary | ICD-10-CM | POA: Diagnosis not present

## 2017-04-27 DIAGNOSIS — R1031 Right lower quadrant pain: Secondary | ICD-10-CM | POA: Diagnosis not present

## 2017-04-27 DIAGNOSIS — N3000 Acute cystitis without hematuria: Secondary | ICD-10-CM | POA: Insufficient documentation

## 2017-04-27 DIAGNOSIS — Z79899 Other long term (current) drug therapy: Secondary | ICD-10-CM | POA: Insufficient documentation

## 2017-04-27 DIAGNOSIS — K5900 Constipation, unspecified: Secondary | ICD-10-CM

## 2017-04-27 LAB — URINALYSIS, ROUTINE W REFLEX MICROSCOPIC
Bilirubin Urine: NEGATIVE
Glucose, UA: NEGATIVE mg/dL
Ketones, ur: NEGATIVE mg/dL
Nitrite: POSITIVE — AB
PROTEIN: NEGATIVE mg/dL
Specific Gravity, Urine: 1.019 (ref 1.005–1.030)
pH: 6 (ref 5.0–8.0)

## 2017-04-27 LAB — CBC
HEMATOCRIT: 43.1 % (ref 36.0–46.0)
HEMOGLOBIN: 14.2 g/dL (ref 12.0–15.0)
MCH: 29 pg (ref 26.0–34.0)
MCHC: 32.9 g/dL (ref 30.0–36.0)
MCV: 88.1 fL (ref 78.0–100.0)
Platelets: 241 10*3/uL (ref 150–400)
RBC: 4.89 MIL/uL (ref 3.87–5.11)
RDW: 13.1 % (ref 11.5–15.5)
WBC: 5.6 10*3/uL (ref 4.0–10.5)

## 2017-04-27 LAB — COMPREHENSIVE METABOLIC PANEL
ALBUMIN: 3.9 g/dL (ref 3.5–5.0)
ALK PHOS: 66 U/L (ref 38–126)
ALT: 12 U/L — ABNORMAL LOW (ref 14–54)
ANION GAP: 8 (ref 5–15)
AST: 20 U/L (ref 15–41)
BUN: 12 mg/dL (ref 6–20)
CALCIUM: 9.3 mg/dL (ref 8.9–10.3)
CO2: 27 mmol/L (ref 22–32)
Chloride: 106 mmol/L (ref 101–111)
Creatinine, Ser: 0.79 mg/dL (ref 0.44–1.00)
GFR calc Af Amer: >60 mL/min (ref >60)
GFR calc non Af Amer: >60 mL/min (ref >60)
GLUCOSE: 93 mg/dL (ref 65–99)
Potassium: 4 mmol/L (ref 3.5–5.1)
SODIUM: 141 mmol/L (ref 135–145)
Total Bilirubin: 0.3 mg/dL (ref 0.3–1.2)
Total Protein: 7.4 g/dL (ref 6.5–8.1)

## 2017-04-27 LAB — LIPASE, BLOOD: Lipase: 36 U/L (ref 11–51)

## 2017-04-27 MED ORDER — SODIUM CHLORIDE 0.9 % IV SOLN: INTRAVENOUS | Status: DC

## 2017-04-27 MED ORDER — IOPAMIDOL (ISOVUE-300) INJECTION 61%
100.0000 mL | Freq: Once | INTRAVENOUS | Status: AC | PRN
  Administered 2017-04-27: 100 mL via INTRAVENOUS

## 2017-04-27 MED ORDER — POLYETHYLENE GLYCOL 3350 17 G PO PACK: 17.0000 g | PACK | Freq: Every day | ORAL | 0 refills | Status: DC

## 2017-04-27 MED ORDER — DEXTROSE 5 % IV SOLN
1.0000 g | Freq: Once | INTRAVENOUS | Status: AC
  Administered 2017-04-27: 1 g via INTRAVENOUS
  Filled 2017-04-27: qty 10

## 2017-04-27 MED ORDER — CEPHALEXIN 500 MG PO CAPS: 500.0000 mg | ORAL_CAPSULE | Freq: Four times a day (QID) | ORAL | 0 refills | Status: DC

## 2017-04-27 MED ORDER — SODIUM CHLORIDE 0.9 % IV SOLN
INTRAVENOUS | Status: DC
  Administered 2017-04-27: 75 mL/h via INTRAVENOUS

## 2017-04-27 NOTE — ED Provider Notes (Addendum)
[facility] DEPT Provider Note   CSN: [id number] Arrival date & time: 04/27/17  1140     History   Chief Complaint Chief Complaint  Patient presents with  . Abdominal Pain    HPI [name] [name] is a 71 y.o. female.  Patient with intermittent right lower quadrant abdominal pain for a week. Patient contact primary care provider they were worried that she may have appendicitis. No nausea vomiting no diarrhea no dysuria. Pain is stated as intermittent nonradiating. Patient does have a history of some chronic pain in the right hip area she wasn't sure if that was part of the pain. She points section to the groin area for the location of the pain.      Past Medical History:  Diagnosis Date  . Thyroid disease     Patient Active Problem List   Diagnosis Date Noted  . Syncope 06/04/2015  . TIA (transient ischemic attack) 06/04/2015  . Faintness   . NECK PAIN 06/24/2008  . SHOULDER PAIN, LEFT 05/04/2008  . IMPINGEMENT SYNDROME 05/04/2008    Past Surgical History:  Procedure Laterality Date  . I&D EXTREMITY  06/28/2012   Procedure: MINOR IRRIGATION AND DEBRIDEMENT EXTREMITY;  Surgeon: Cammie Sickle., MD;  Location: [facility];  Service: Orthopedics;  Laterality: Right;  Debride Mucoid Cyst, Debride Joint     OB History    Gravida Para Term Preterm AB Living   8 2     6      SAB TAB Ectopic Multiple Live Births   6               Home Medications    Prior to Admission medications   Medication Sig Start Date End Date Taking? Authorizing Provider  gabapentin (NEURONTIN) 100 MG capsule Take 100 mg by mouth at bedtime.  02/13/17  Yes [provider]  levothyroxine (SYNTHROID, LEVOTHROID) 50 MCG tablet Take 1 tablet (50 mcg total) by mouth daily before breakfast. Patient taking differently: Take 75 mcg by mouth daily before breakfast.  06/06/15  Yes Barton Dubois, MD  omeprazole (PRILOSEC) 20 MG capsule Take 1 capsule (20 mg total) by mouth daily.  06/05/15  Yes Barton Dubois, MD  cephALEXin (KEFLEX) 500 MG capsule Take 1 capsule (500 mg total) by mouth 4 (four) times daily. 04/27/17   Fredia Sorrow, MD  polyethylene glycol [facility]) packet Take 17 g by mouth daily. 04/27/17   Fredia Sorrow, MD    Family History Family History  Problem Relation Age of Onset  . Cancer Father     Social History Social History  Substance Use Topics  . Smoking status: Never Smoker  . Smokeless tobacco: Never Used  . Alcohol use No     Allergies   Amoxicillin-pot clavulanate and Ciprofloxacin   Review of Systems Review of Systems  HENT: Negative for congestion.   Eyes: Negative for redness.  Respiratory: Negative for shortness of breath.   Cardiovascular: Negative for chest pain.  Gastrointestinal: Positive for abdominal pain. Negative for diarrhea, nausea and vomiting.  Genitourinary: Negative for dysuria and hematuria.  Musculoskeletal: Negative for back pain.  Skin: Negative for rash.  Neurological: Negative for headaches.  Hematological: Does not bruise/bleed easily.  Psychiatric/Behavioral: Negative for confusion.     Physical Exam Updated Vital Signs BP (!) 156/60 (BP Location: Right Arm)   Pulse (!) 50   Temp 98.3 F (36.8 C) (Oral)   Resp 18   Wt 79.8 kg (176 lb)   SpO2 100%  BMI 29.29 kg/m   Physical Exam  Constitutional: She is oriented to person, place, and time. She appears well-developed and well-nourished. No distress.  HENT:  Head: Normocephalic and atraumatic.  Mouth/Throat: Oropharynx is clear and moist.  Eyes: Pupils are equal, round, and reactive to light. Conjunctivae and EOM are normal.  Neck: Normal range of motion. Neck supple.  Cardiovascular: Normal rate and regular rhythm.   Pulmonary/Chest: Effort normal and breath sounds normal.  Abdominal: Soft. Bowel sounds are normal.  Mild tenderness to palpation right lower quadrant area.  Musculoskeletal: Normal range of motion.  Neurological:  She is alert and oriented to person, place, and time. No cranial nerve deficit or sensory deficit. She exhibits normal muscle tone. Coordination normal.  Skin: Skin is warm.  Nursing note and vitals reviewed.    ED Treatments / Results  Labs (all labs ordered are listed, but only abnormal results are displayed) Labs Reviewed  COMPREHENSIVE METABOLIC PANEL - Abnormal; Notable for the following:       Result Value   ALT 12 (*)    All other components within normal limits  URINALYSIS, ROUTINE W REFLEX MICROSCOPIC - Abnormal; Notable for the following:    APPearance HAZY (*)    Hgb urine dipstick SMALL (*)    Nitrite POSITIVE (*)    Leukocytes, UA LARGE (*)    Bacteria, UA RARE (*)    Squamous Epithelial / LPF 0-5 (*)    All other components within normal limits  URINE CULTURE  LIPASE, BLOOD  CBC    EKG  EKG Interpretation None       Radiology Ct Abdomen Pelvis W Contrast  Result Date: 04/27/2017 CLINICAL DATA:  Intermittent right lower quadrant pain x1 week. EXAM: CT ABDOMEN AND PELVIS WITH CONTRAST TECHNIQUE: Multidetector CT imaging of the abdomen and pelvis was performed using the standard protocol following bolus administration of intravenous contrast. CONTRAST:  120mL ISOVUE-300 IOPAMIDOL (ISOVUE-300) INJECTION 61% COMPARISON:  None. FINDINGS: Lower chest: Bibasilar dependent atelectasis.  No acute abnormality. Hepatobiliary: The right hepatic dome is excluded on both the initial and delayed images. The included liver is homogeneous in appearance without space-occupying mass. No biliary dilatation is identified. The gallbladder is normal. Pancreas: Normal without ductal dilatation, mass or inflammation. Spleen: Normal without mass. Adrenals/Urinary Tract: Normal bilateral adrenal glands. Small parapelvic left-sided renal cysts. No obstructive uropathy. No enhancing renal masses. The urinary bladder is decompressed. Delayed images demonstrate symmetric appearing pyelograms.  Stomach/Bowel: Nondistended stomach with normal small bowel rotation. No bowel obstruction or inflammation. Normal-appearing appendix. A moderate amount of fecal residue is seen from cecum through descending colon with sigmoid diverticulosis noted. No evidence of diverticulitis. Vascular/Lymphatic: Mild aortoiliac branch vessel atherosclerosis without aneurysm. Reproductive: Status post hysterectomy. No adnexal masses. Other: Tiny fat containing umbilical hernia. No abdominopelvic ascites. Musculoskeletal: Moderate disc space narrowing at L1-2 with vacuum disc phenomenon mild disc bulging. Degenerative grade 1 anterolisthesis of L4 on L5 associated with facet arthropathy. No pars defects. Moderate-to-marked disc space narrowing at L5-S1. No acute nor suspicious osseous abnormalities. IMPRESSION: 1. Normal appendix. 2. Increased colonic stool burden from cecum through descending colon. 3. No bowel obstruction or acute inflammation. 4. Sigmoid diverticulosis without acute diverticulitis. 5. Degenerative changes are seen along the lumbar spine. Electronically Signed   By: Ashley Royalty M.D.   On: 04/27/2017 15:14    Procedures Procedures (including critical care time)  Medications Ordered in ED Medications  0.9 %  sodium chloride infusion (75 mL/hr Intravenous New Bag/Given 04/27/17  1439)  cefTRIAXone (ROCEPHIN) 1 g in dextrose 5 % 50 mL IVPB (0 g Intravenous Stopped 04/27/17 1510)  iopamidol (ISOVUE-300) 61 % injection 100 mL (100 mLs Intravenous Contrast Given 04/27/17 1448)     Initial Impression / Assessment and Plan / ED Course  I have reviewed the triage vital signs and the nursing notes.  Pertinent labs & imaging results that were available during my care of the patient were reviewed by me and considered in my medical decision making (see chart for details).   CT scan of the abdomen without any acute findings. Patient does have increased stool burden. The patient does not have a complaint of  constipation. Will treat with MiraLAX. Incidental finding of urinary tract infection positive nitrite. Patient treated here with 1 g of Rocephin will be continued with Keflex." Follow-up with her doctors will be important.  Final Clinical Impressions(s) / ED Diagnoses   Final diagnoses:  Lower abdominal pain  Acute cystitis without hematuria  Constipation, unspecified constipation type    New Prescriptions New Prescriptions   CEPHALEXIN (KEFLEX) 500 MG CAPSULE    Take 1 capsule (500 mg total) by mouth 4 (four) times daily.   POLYETHYLENE GLYCOL (MIRALAX) PACKET    Take 17 g by mouth daily.     Fredia Sorrow, MD 04/27/17 1727    Fredia Sorrow, MD 04/27/17 [phone]

## 2017-04-27 NOTE — Discharge Instructions (Signed)
Take antibiotic Keflex for the urinary tract infection. Recommend MiraLAX once daily for the next 7 days. Make an appointment to follow-up with your doctor. CT scan without any other significant findings.

## 2017-04-27 NOTE — ED Triage Notes (Signed)
Pt c/o intermittent rlq abd pain x 1 week. Denies n/v/d.

## 2017-04-30 LAB — URINE CULTURE: Culture: >=100000 — AB

## 2017-05-04 DIAGNOSIS — M25551 Pain in right hip: Secondary | ICD-10-CM | POA: Diagnosis not present

## 2017-05-04 DIAGNOSIS — I491 Atrial premature depolarization: Secondary | ICD-10-CM | POA: Diagnosis not present

## 2017-05-04 DIAGNOSIS — M1611 Unilateral primary osteoarthritis, right hip: Secondary | ICD-10-CM | POA: Diagnosis not present

## 2017-05-04 DIAGNOSIS — D126 Benign neoplasm of colon, unspecified: Secondary | ICD-10-CM | POA: Diagnosis not present

## 2017-05-04 DIAGNOSIS — G629 Polyneuropathy, unspecified: Secondary | ICD-10-CM | POA: Diagnosis not present

## 2017-05-04 DIAGNOSIS — I10 Essential (primary) hypertension: Secondary | ICD-10-CM | POA: Diagnosis not present

## 2017-05-04 DIAGNOSIS — M4316 Spondylolisthesis, lumbar region: Secondary | ICD-10-CM | POA: Diagnosis not present

## 2017-05-04 DIAGNOSIS — E039 Hypothyroidism, unspecified: Secondary | ICD-10-CM | POA: Diagnosis not present

## 2017-06-06 DIAGNOSIS — J040 Acute laryngitis: Secondary | ICD-10-CM | POA: Diagnosis not present

## 2017-06-06 DIAGNOSIS — Z6828 Body mass index (BMI) 28.0-28.9, adult: Secondary | ICD-10-CM | POA: Diagnosis not present

## 2017-06-06 DIAGNOSIS — J069 Acute upper respiratory infection, unspecified: Secondary | ICD-10-CM | POA: Diagnosis not present

## 2017-06-30 DIAGNOSIS — Z23 Encounter for immunization: Secondary | ICD-10-CM | POA: Diagnosis not present

## 2017-09-25 DIAGNOSIS — T699XXA Effect of reduced temperature, unspecified, initial encounter: Secondary | ICD-10-CM | POA: Diagnosis not present

## 2017-09-25 DIAGNOSIS — J069 Acute upper respiratory infection, unspecified: Secondary | ICD-10-CM | POA: Diagnosis not present

## 2017-09-25 DIAGNOSIS — I10 Essential (primary) hypertension: Secondary | ICD-10-CM | POA: Diagnosis not present

## 2017-09-25 DIAGNOSIS — Z6829 Body mass index (BMI) 29.0-29.9, adult: Secondary | ICD-10-CM | POA: Diagnosis not present

## 2017-10-02 DIAGNOSIS — J209 Acute bronchitis, unspecified: Secondary | ICD-10-CM | POA: Diagnosis not present

## 2017-10-02 DIAGNOSIS — Z6828 Body mass index (BMI) 28.0-28.9, adult: Secondary | ICD-10-CM | POA: Diagnosis not present

## 2017-10-02 DIAGNOSIS — E039 Hypothyroidism, unspecified: Secondary | ICD-10-CM | POA: Diagnosis not present

## 2017-10-02 DIAGNOSIS — G629 Polyneuropathy, unspecified: Secondary | ICD-10-CM | POA: Diagnosis not present

## 2017-10-02 DIAGNOSIS — I10 Essential (primary) hypertension: Secondary | ICD-10-CM | POA: Diagnosis not present

## 2017-10-25 DIAGNOSIS — Z8249 Family history of ischemic heart disease and other diseases of the circulatory system: Secondary | ICD-10-CM | POA: Diagnosis not present

## 2017-10-25 DIAGNOSIS — E039 Hypothyroidism, unspecified: Secondary | ICD-10-CM | POA: Diagnosis not present

## 2017-10-25 DIAGNOSIS — K219 Gastro-esophageal reflux disease without esophagitis: Secondary | ICD-10-CM | POA: Diagnosis not present

## 2017-10-25 DIAGNOSIS — Z825 Family history of asthma and other chronic lower respiratory diseases: Secondary | ICD-10-CM | POA: Diagnosis not present

## 2017-10-25 DIAGNOSIS — Z881 Allergy status to other antibiotic agents status: Secondary | ICD-10-CM | POA: Diagnosis not present

## 2017-12-26 DIAGNOSIS — I10 Essential (primary) hypertension: Secondary | ICD-10-CM | POA: Diagnosis not present

## 2017-12-26 DIAGNOSIS — E782 Mixed hyperlipidemia: Secondary | ICD-10-CM | POA: Diagnosis not present

## 2017-12-26 DIAGNOSIS — R5383 Other fatigue: Secondary | ICD-10-CM | POA: Diagnosis not present

## 2017-12-26 DIAGNOSIS — R001 Bradycardia, unspecified: Secondary | ICD-10-CM | POA: Diagnosis not present

## 2017-12-26 DIAGNOSIS — E039 Hypothyroidism, unspecified: Secondary | ICD-10-CM | POA: Diagnosis not present

## 2017-12-26 DIAGNOSIS — E538 Deficiency of other specified B group vitamins: Secondary | ICD-10-CM | POA: Diagnosis not present

## 2017-12-26 DIAGNOSIS — K219 Gastro-esophageal reflux disease without esophagitis: Secondary | ICD-10-CM | POA: Diagnosis not present

## 2018-01-01 DIAGNOSIS — I10 Essential (primary) hypertension: Secondary | ICD-10-CM | POA: Diagnosis not present

## 2018-01-01 DIAGNOSIS — R5383 Other fatigue: Secondary | ICD-10-CM | POA: Diagnosis not present

## 2018-01-01 DIAGNOSIS — E782 Mixed hyperlipidemia: Secondary | ICD-10-CM | POA: Diagnosis not present

## 2018-01-01 DIAGNOSIS — Z6828 Body mass index (BMI) 28.0-28.9, adult: Secondary | ICD-10-CM | POA: Diagnosis not present

## 2018-01-01 DIAGNOSIS — E039 Hypothyroidism, unspecified: Secondary | ICD-10-CM | POA: Diagnosis not present

## 2018-01-30 DIAGNOSIS — Z111 Encounter for screening for respiratory tuberculosis: Secondary | ICD-10-CM | POA: Diagnosis not present

## 2018-03-28 DIAGNOSIS — K219 Gastro-esophageal reflux disease without esophagitis: Secondary | ICD-10-CM | POA: Diagnosis not present

## 2018-03-28 DIAGNOSIS — E039 Hypothyroidism, unspecified: Secondary | ICD-10-CM | POA: Diagnosis not present

## 2018-03-28 DIAGNOSIS — E538 Deficiency of other specified B group vitamins: Secondary | ICD-10-CM | POA: Diagnosis not present

## 2018-03-28 DIAGNOSIS — R5383 Other fatigue: Secondary | ICD-10-CM | POA: Diagnosis not present

## 2018-03-28 DIAGNOSIS — I10 Essential (primary) hypertension: Secondary | ICD-10-CM | POA: Diagnosis not present

## 2018-03-28 DIAGNOSIS — E782 Mixed hyperlipidemia: Secondary | ICD-10-CM | POA: Diagnosis not present

## 2018-03-28 DIAGNOSIS — R001 Bradycardia, unspecified: Secondary | ICD-10-CM | POA: Diagnosis not present

## 2018-04-02 DIAGNOSIS — M4316 Spondylolisthesis, lumbar region: Secondary | ICD-10-CM | POA: Diagnosis not present

## 2018-04-02 DIAGNOSIS — R5383 Other fatigue: Secondary | ICD-10-CM | POA: Diagnosis not present

## 2018-04-02 DIAGNOSIS — E039 Hypothyroidism, unspecified: Secondary | ICD-10-CM | POA: Diagnosis not present

## 2018-04-02 DIAGNOSIS — K5730 Diverticulosis of large intestine without perforation or abscess without bleeding: Secondary | ICD-10-CM | POA: Diagnosis not present

## 2018-04-02 DIAGNOSIS — E559 Vitamin D deficiency, unspecified: Secondary | ICD-10-CM | POA: Diagnosis not present

## 2018-04-02 DIAGNOSIS — E782 Mixed hyperlipidemia: Secondary | ICD-10-CM | POA: Diagnosis not present

## 2018-04-02 DIAGNOSIS — I10 Essential (primary) hypertension: Secondary | ICD-10-CM | POA: Diagnosis not present

## 2018-04-02 DIAGNOSIS — Z0000 Encounter for general adult medical examination without abnormal findings: Secondary | ICD-10-CM | POA: Diagnosis not present

## 2018-04-02 DIAGNOSIS — K219 Gastro-esophageal reflux disease without esophagitis: Secondary | ICD-10-CM | POA: Diagnosis not present

## 2018-04-02 DIAGNOSIS — D126 Benign neoplasm of colon, unspecified: Secondary | ICD-10-CM | POA: Diagnosis not present

## 2018-04-18 DIAGNOSIS — E538 Deficiency of other specified B group vitamins: Secondary | ICD-10-CM | POA: Diagnosis not present

## 2018-04-18 DIAGNOSIS — R5383 Other fatigue: Secondary | ICD-10-CM | POA: Diagnosis not present

## 2018-04-20 DIAGNOSIS — M545 Low back pain: Secondary | ICD-10-CM | POA: Diagnosis not present

## 2018-04-20 DIAGNOSIS — Z6828 Body mass index (BMI) 28.0-28.9, adult: Secondary | ICD-10-CM | POA: Diagnosis not present

## 2018-04-22 ENCOUNTER — Emergency Department (HOSPITAL_COMMUNITY)
Admission: EM | Admit: 2018-04-22 | Discharge: 2018-04-22 | Disposition: A | Discharge status: Home / Self Care | Payer: Medicare HMO | Attending: Emergency Medicine | Admitting: Emergency Medicine

## 2018-04-22 ENCOUNTER — Other Ambulatory Visit: Payer: Self-pay

## 2018-04-22 ENCOUNTER — Emergency Department (HOSPITAL_COMMUNITY): Payer: Medicare HMO

## 2018-04-22 ENCOUNTER — Encounter (HOSPITAL_COMMUNITY): Payer: Self-pay | Admitting: Emergency Medicine

## 2018-04-22 DIAGNOSIS — X509XXA Other and unspecified overexertion or strenuous movements or postures, initial encounter: Secondary | ICD-10-CM | POA: Insufficient documentation

## 2018-04-22 DIAGNOSIS — Y999 Unspecified external cause status: Secondary | ICD-10-CM | POA: Diagnosis not present

## 2018-04-22 DIAGNOSIS — M5136 Other intervertebral disc degeneration, lumbar region: Secondary | ICD-10-CM | POA: Diagnosis not present

## 2018-04-22 DIAGNOSIS — S3992XA Unspecified injury of lower back, initial encounter: Secondary | ICD-10-CM | POA: Diagnosis present

## 2018-04-22 DIAGNOSIS — Z79899 Other long term (current) drug therapy: Secondary | ICD-10-CM | POA: Insufficient documentation

## 2018-04-22 DIAGNOSIS — Y929 Unspecified place or not applicable: Secondary | ICD-10-CM | POA: Insufficient documentation

## 2018-04-22 DIAGNOSIS — Y939 Activity, unspecified: Secondary | ICD-10-CM | POA: Diagnosis not present

## 2018-04-22 DIAGNOSIS — M51369 Other intervertebral disc degeneration, lumbar region without mention of lumbar back pain or lower extremity pain: Secondary | ICD-10-CM

## 2018-04-22 DIAGNOSIS — S39012A Strain of muscle, fascia and tendon of lower back, initial encounter: Secondary | ICD-10-CM | POA: Diagnosis not present

## 2018-04-22 DIAGNOSIS — M545 Low back pain: Secondary | ICD-10-CM | POA: Diagnosis not present

## 2018-04-22 MED ORDER — CYCLOBENZAPRINE HCL 10 MG PO TABS: 10.0000 mg | ORAL_TABLET | Freq: Three times a day (TID) | ORAL | 0 refills | Status: DC

## 2018-04-22 MED ORDER — ONDANSETRON HCL 4 MG PO TABS
4.0000 mg | ORAL_TABLET | Freq: Once | ORAL | Status: AC
  Administered 2018-04-22: 4 mg via ORAL
  Filled 2018-04-22: qty 1

## 2018-04-22 MED ORDER — HYDROCODONE-ACETAMINOPHEN 5-325 MG PO TABS: 1.0000 | ORAL_TABLET | ORAL | 0 refills | Status: DC | PRN

## 2018-04-22 MED ORDER — MORPHINE SULFATE (PF) 4 MG/ML IV SOLN
4.0000 mg | Freq: Once | INTRAVENOUS | Status: AC
  Administered 2018-04-22: 4 mg via INTRAMUSCULAR
  Filled 2018-04-22: qty 1

## 2018-04-22 MED ORDER — CYCLOBENZAPRINE HCL 10 MG PO TABS
10.0000 mg | ORAL_TABLET | Freq: Once | ORAL | Status: AC
  Administered 2018-04-22: 10 mg via ORAL
  Filled 2018-04-22: qty 1

## 2018-04-22 MED ORDER — TRAMADOL HCL 50 MG PO TABS
100.0000 mg | ORAL_TABLET | Freq: Once | ORAL | Status: AC
  Administered 2018-04-22: 100 mg via ORAL
  Filled 2018-04-22: qty 2

## 2018-04-22 MED ORDER — TRAMADOL HCL 50 MG PO TABS: 50.0000 mg | ORAL_TABLET | Freq: Four times a day (QID) | ORAL | 0 refills | Status: DC | PRN

## 2018-04-22 NOTE — ED Notes (Signed)
Dr Zammit at bedside. 

## 2018-04-22 NOTE — ED Provider Notes (Signed)
[facility] EMERGENCY DEPARTMENT Provider Note   CSN: [id number] Arrival date & time: 04/22/18  1519     History   Chief Complaint Chief Complaint  Patient presents with  . Back Pain    HPI [name] [name] is a 72 y.o. female.  Patient is a 72 year old female who presents to the emergency department with a complaint of lower back pain.  The patient states that she has been having some problems with her back for quite some time, in the last week this has been progressively worse.  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  She developed some pain in between her shoulders.  She was placed on prednisone by her primary physician, but had to stop this because she started noticing blood in her stool.  The patient now has pain in her lower back.  She describes it as a stabbing type pain that lasts 10 to 15 seconds, it goes away and then it will return.  There was no loss of bowel or bladder function.  There was no numbness or tingling in the saddle area.  It is not dependent on any particular activity.  The patient has tried Tylenol and ibuprofen and heat, but states none of these seem to be helping.  Patient had her urine checked approximately a week ago by her primary physician and there was no sign of infection, or kidney stone.  Patient has no history of aneurysmal changes.  She has had a history of degenerative changes in her upper, middle, and lower back.  She presents now for assistance with this issue.  The history is provided by the patient.  Back Pain   Pertinent negatives include no chest pain, no abdominal pain and no dysuria.    Past Medical History:  Diagnosis Date  . Thyroid disease     Patient Active Problem List   Diagnosis Date Noted  . Syncope 06/04/2015  . TIA (transient ischemic attack) 06/04/2015  . Faintness   . NECK PAIN 06/24/2008  . SHOULDER PAIN, LEFT 05/04/2008  . IMPINGEMENT SYNDROME 05/04/2008    Past Surgical  History:  Procedure Laterality Date  . I&D EXTREMITY  06/28/2012   Procedure: MINOR IRRIGATION AND DEBRIDEMENT EXTREMITY;  Surgeon: Cammie Sickle., MD;  Location: [place];  Service: Orthopedics;  Laterality: Right;  Debride Mucoid Cyst, Debride Joint      OB History    Gravida  8   Para  2   Term      Preterm      AB  6   Living        SAB  6   TAB      Ectopic      Multiple      Live Births               Home Medications    Prior to Admission medications   Medication Sig Start Date End Date Taking? Authorizing Provider  cephALEXin (KEFLEX) 500 MG capsule Take 1 capsule (500 mg total) by mouth 4 (four) times daily. 04/27/17   Fredia Sorrow, MD  gabapentin (NEURONTIN) 100 MG capsule Take 100 mg by mouth at bedtime.  02/13/17   [provider]  levothyroxine (SYNTHROID, LEVOTHROID) 50 MCG tablet Take 1 tablet (50 mcg total) by mouth daily before breakfast. Patient taking differently: Take 75 mcg by mouth daily before breakfast.  06/06/15   Barton Dubois, MD  omeprazole ([place]) 20 MG  capsule Take 1 capsule (20 mg total) by mouth daily. 06/05/15   Barton Dubois, MD  polyethylene glycol [facility]) packet Take 17 g by mouth daily. 04/27/17   Fredia Sorrow, MD    Family History Family History  Problem Relation Age of Onset  . Cancer Father     Social History Social History   Tobacco Use  . Smoking status: Never Smoker  . Smokeless tobacco: Never Used  Substance Use Topics  . Alcohol use: No    Alcohol/week: 0.0 standard drinks  . Drug use: No     Allergies   Amoxicillin-pot clavulanate and Ciprofloxacin   Review of Systems Review of Systems  Constitutional: Negative for activity change.       All ROS Neg except as noted in HPI  HENT: Negative for nosebleeds.   Eyes: Negative for photophobia and discharge.  Respiratory: Negative for cough, shortness of breath and wheezing.   Cardiovascular: Negative for chest  pain and palpitations.  Gastrointestinal: Negative for abdominal pain and blood in stool.  Genitourinary: Negative for dysuria, frequency and hematuria.  Musculoskeletal: Positive for back pain. Negative for arthralgias and neck pain.  Skin: Negative.   Neurological: Negative for dizziness, seizures and speech difficulty.  Psychiatric/Behavioral: Negative for confusion and hallucinations.     Physical Exam Updated Vital Signs BP (!) 177/78 (BP Location: Right Arm)   Pulse (!) 52   Temp 97.8 F (36.6 C) (Oral)   Resp 18   Ht 5\' 6"  (1.676 m)   Wt 77.1 kg   SpO2 100%   BMI 27.44 kg/m   Physical Exam  Constitutional: She is oriented to person, place, and time. She appears well-developed and well-nourished.  Non-toxic appearance.  HENT:  Head: Normocephalic.  Right Ear: Tympanic membrane and external ear normal.  Left Ear: Tympanic membrane and external ear normal.  Eyes: Pupils are equal, round, and reactive to light. EOM and lids are normal.  Neck: Normal range of motion. Neck supple. Carotid bruit is not present.  Cardiovascular: Normal rate, regular rhythm, normal heart sounds, intact distal pulses and normal pulses.  Pulmonary/Chest: Breath sounds normal. No respiratory distress.  Abdominal: Soft. Bowel sounds are normal. There is no tenderness. There is no guarding.  Musculoskeletal:       Lumbar back: She exhibits decreased range of motion, pain and spasm.  Lymphadenopathy:       Head (right side): No submandibular adenopathy present.       Head (left side): No submandibular adenopathy present.    She has no cervical adenopathy.  Neurological: She is alert and oriented to person, place, and time. She has normal strength. No cranial nerve deficit or sensory deficit.  Skin: Skin is warm and dry.  Psychiatric: She has a normal mood and affect. Her speech is normal.  Nursing note and vitals reviewed.    ED Treatments / Results  Labs (all labs ordered are listed, but  only abnormal results are displayed) Labs Reviewed - No data to display  EKG None  Radiology No results found.  Procedures Procedures (including critical care time)  Medications Ordered in ED Medications  cyclobenzaprine (FLEXERIL) tablet 10 mg (has no administration in time range)  traMADol (ULTRAM) tablet 100 mg (has no administration in time range)  ondansetron (ZOFRAN) tablet 4 mg (has no administration in time range)     Initial Impression / Assessment and Plan / ED Course  I have reviewed the triage vital signs and the nursing notes.  Pertinent labs &  imaging results that were available during my care of the patient were reviewed by me and considered in my medical decision making (see chart for details).       Final Clinical Impressions(s) / ED Diagnoses  MDM  Blood pressure elevated. Vital signs otherwise stable. Pt seen with me by Dr Roderic Palau.  There is no numbness or abnormality in the saddle area.  No evidence for cauda equina.  Patient denies the use of any IV drugs.  Has not had fevers.  Has not had injury or trauma to the back other than change in activities at her work.  X-ray of the lumbar spine obtained to rule out compression of injury.  There is some anterior listhesis at the L4 on L5 area.  There is severe narrowing at the L5-S1 disc area.  There is no vertebral compression noted.  There is noted some osteopenia.  Patient will be treated with Flexeril, and hydrocodone.  Patient will use Tylenol extra strength for mild pain.  Patient is referred to orthopedics for additional evaluation and management of this issue.  Patient is in agreement with this plan.   Final diagnoses:  Strain of lumbar region, initial encounter  Degenerative disc disease, lumbar    ED Discharge Orders         Ordered    cyclobenzaprine (FLEXERIL) 10 MG tablet  3 times daily,   Status:  Discontinued     04/22/18 1759    traMADol (ULTRAM) 50 MG tablet  Every 6 hours PRN,    Status:  Discontinued     04/22/18 1759    HYDROcodone-acetaminophen (NORCO/VICODIN) 5-325 MG tablet  Every 4 hours PRN     04/22/18 1907           Lily Kocher, PA-C 04/24/18 1000    Milton Ferguson, MD 04/24/18 1433

## 2018-04-22 NOTE — Discharge Instructions (Addendum)
Your blood pressure is slightly elevated, but otherwise your vital signs are within normal limits.  Your oxygen level is 100% on room air.  The x-ray of your lower back suggest degenerative disc disease present.  There is no fractures appreciated.  There are no gross neurologic deficits appreciated on your examination at this time.  Your examination favors muscle strain and probably aggravation from the degenerative disc disease problem.  Please use a heating pad to your lower back.  Use Tylenol extra strength for mild pain.  Use norco for more severe pain.  Please use Flexeril 3 times daily for spasm pain.  Flexeril and norco may cause drowsiness.  Please use caution getting around.  Please do not drive a vehicle, drink alcohol, participate in activities requiring concentration, operate machinery or handle legal documents while taking these medications.  Please see Dr. Pleas Koch later this week for follow-up of your back issue.

## 2018-04-22 NOTE — ED Triage Notes (Signed)
Pt c/o of left lower back pain x 4 days. Denies any urinary symptoms

## 2018-04-24 ENCOUNTER — Encounter (INDEPENDENT_AMBULATORY_CARE_PROVIDER_SITE_OTHER): Payer: Self-pay | Admitting: Internal Medicine

## 2018-04-24 ENCOUNTER — Encounter (INDEPENDENT_AMBULATORY_CARE_PROVIDER_SITE_OTHER): Payer: Self-pay | Admitting: *Deleted

## 2018-04-24 ENCOUNTER — Ambulatory Visit (INDEPENDENT_AMBULATORY_CARE_PROVIDER_SITE_OTHER): Payer: Medicare HMO | Admitting: Internal Medicine

## 2018-04-24 VITALS — BP 136/80 | HR 60 | Temp 97.9°F | Ht 66.0 in | Wt 171.1 lb

## 2018-04-24 DIAGNOSIS — R151 Fecal smearing: Secondary | ICD-10-CM

## 2018-04-24 DIAGNOSIS — D369 Benign neoplasm, unspecified site: Secondary | ICD-10-CM

## 2018-04-24 DIAGNOSIS — K625 Hemorrhage of anus and rectum: Secondary | ICD-10-CM | POA: Diagnosis not present

## 2018-04-24 NOTE — Patient Instructions (Signed)
The risks of bleeding, perforation and infection were reviewed with patient.  

## 2018-04-24 NOTE — Progress Notes (Signed)
   Subjective:    Patient ID: [name] [name], female    DOB: 05-21-1946, 72 y.o.   MRN: [id number]  HPI Referred by Dr. Pleas Koch for tubular adenoma, fecal incontinence.Last colonoscopy in December of 2011 showed few diverticula at sigmoid colon. Small cecal polyp. Small polyp at rectum, Colon polyp. Marland Kitchen  Biopsy: All were tubular adenomas. No high grade dysplasia. She tells me she is overdue for a colonoscopy. She tells me sometimes she has fecal incontinence . Sometimes it may last a day or 2 days.  She says she saw some blood on the toilet tissue this past Saturday night. She did have diarrhea during this time.  Her appetite is okay. No weight loss. Has a BM  Daily.  She thinks her mother's sister had colon cancer in her 33s.    Retired as a [employer]arry manager.   Review of Systems Past Medical History:  Diagnosis Date  . Thyroid disease     Past Surgical History:  Procedure Laterality Date  . I&D EXTREMITY  06/28/2012   Procedure: MINOR IRRIGATION AND DEBRIDEMENT EXTREMITY;  Surgeon: Cammie Sickle., MD;  Location: [place];  Service: Orthopedics;  Laterality: Right;  Debride Mucoid Cyst, Debride Joint     Allergies  Allergen Reactions  . Amoxicillin-Pot Clavulanate Hives and Rash  . Ciprofloxacin Rash    Current Outpatient Medications on File Prior to Visit  Medication Sig Dispense Refill  . HYDROcodone-acetaminophen (NORCO/VICODIN) 5-325 MG tablet Take 1 tablet by mouth every 4 (four) hours as needed. 12 tablet 0  . ibuprofen (ADVIL,MOTRIN) 200 MG tablet Take 200 mg by mouth every 6 (six) hours as needed.    Marland Kitchen levothyroxine (SYNTHROID, LEVOTHROID) 50 MCG tablet Take 1 tablet (50 mcg total) by mouth daily before breakfast. (Patient taking differently: Take 75 mcg by mouth daily before breakfast. ) 30 tablet 1  . omeprazole (PRILOSEC) 20 MG capsule Take 1 capsule (20 mg total) by mouth daily. 30 capsule 1   No current facility-administered medications on file prior to visit.          Objective:   Physical Exam Blood pressure 136/80, pulse 60, temperature 97.9 F (36.6 C), height 5\' 6"  (1.676 m), weight 171 lb 1.6 oz (77.6 kg). Alert and oriented. Skin warm and dry. Oral mucosa is moist.   . Sclera anicteric, conjunctivae is pink. Thyroid not enlarged. No cervical lymphadenopathy. Lungs clear. Heart regular rate and rhythm.  Abdomen is soft. Bowel sounds are positive. No hepatomegaly. No abdominal masses felt. No tenderness.  No edema to lower extremities.          Assessment & Plan:  Fecal incontinence: Try Fiber to firm stools up.  Tubular adenoma: Needs surveillance colonoscopy.  Rectal bleeding x a couple of episodes. Colonoscopy.

## 2018-04-29 ENCOUNTER — Encounter (HOSPITAL_COMMUNITY)
Admission: RE | Disposition: A | Discharge status: Home / Self Care | Payer: Self-pay | Source: Ambulatory Visit | Attending: Internal Medicine

## 2018-04-29 ENCOUNTER — Other Ambulatory Visit: Payer: Self-pay

## 2018-04-29 ENCOUNTER — Encounter (HOSPITAL_COMMUNITY): Payer: Self-pay

## 2018-04-29 ENCOUNTER — Ambulatory Visit (HOSPITAL_COMMUNITY)
Admission: RE | Admit: 2018-04-29 | Discharge: 2018-04-29 | Disposition: A | Discharge status: Home / Self Care | Payer: Medicare HMO | Source: Ambulatory Visit | Attending: Internal Medicine | Admitting: Internal Medicine

## 2018-04-29 DIAGNOSIS — Z888 Allergy status to other drugs, medicaments and biological substances status: Secondary | ICD-10-CM | POA: Diagnosis not present

## 2018-04-29 DIAGNOSIS — K219 Gastro-esophageal reflux disease without esophagitis: Secondary | ICD-10-CM | POA: Diagnosis not present

## 2018-04-29 DIAGNOSIS — K644 Residual hemorrhoidal skin tags: Secondary | ICD-10-CM | POA: Diagnosis not present

## 2018-04-29 DIAGNOSIS — K648 Other hemorrhoids: Secondary | ICD-10-CM | POA: Insufficient documentation

## 2018-04-29 DIAGNOSIS — Z800 Family history of malignant neoplasm of digestive organs: Secondary | ICD-10-CM | POA: Diagnosis not present

## 2018-04-29 DIAGNOSIS — K5730 Diverticulosis of large intestine without perforation or abscess without bleeding: Secondary | ICD-10-CM | POA: Diagnosis not present

## 2018-04-29 DIAGNOSIS — Z79899 Other long term (current) drug therapy: Secondary | ICD-10-CM | POA: Diagnosis not present

## 2018-04-29 DIAGNOSIS — E079 Disorder of thyroid, unspecified: Secondary | ICD-10-CM | POA: Insufficient documentation

## 2018-04-29 DIAGNOSIS — Z1211 Encounter for screening for malignant neoplasm of colon: Secondary | ICD-10-CM | POA: Diagnosis not present

## 2018-04-29 DIAGNOSIS — D369 Benign neoplasm, unspecified site: Secondary | ICD-10-CM

## 2018-04-29 DIAGNOSIS — Z881 Allergy status to other antibiotic agents status: Secondary | ICD-10-CM | POA: Diagnosis not present

## 2018-04-29 DIAGNOSIS — D122 Benign neoplasm of ascending colon: Secondary | ICD-10-CM | POA: Insufficient documentation

## 2018-04-29 DIAGNOSIS — R151 Fecal smearing: Secondary | ICD-10-CM

## 2018-04-29 DIAGNOSIS — Z09 Encounter for follow-up examination after completed treatment for conditions other than malignant neoplasm: Secondary | ICD-10-CM | POA: Diagnosis not present

## 2018-04-29 DIAGNOSIS — Z86010 Personal history of colonic polyps: Secondary | ICD-10-CM | POA: Insufficient documentation

## 2018-04-29 DIAGNOSIS — Z79890 Hormone replacement therapy (postmenopausal): Secondary | ICD-10-CM | POA: Insufficient documentation

## 2018-04-29 DIAGNOSIS — K625 Hemorrhage of anus and rectum: Secondary | ICD-10-CM

## 2018-04-29 HISTORY — PX: COLONOSCOPY: SHX5424

## 2018-04-29 HISTORY — DX: Gastro-esophageal reflux disease without esophagitis: K21.9

## 2018-04-29 HISTORY — DX: Anxiety disorder, unspecified: F41.9

## 2018-04-29 HISTORY — PX: POLYPECTOMY: SHX149

## 2018-04-29 SURGERY — COLONOSCOPY
Anesthesia: Moderate Sedation

## 2018-04-29 MED ORDER — MIDAZOLAM HCL 5 MG/5ML IJ SOLN
INTRAMUSCULAR | Status: AC
  Filled 2018-04-29: qty 10

## 2018-04-29 MED ORDER — MIDAZOLAM HCL 5 MG/5ML IJ SOLN
INTRAMUSCULAR | Status: DC | PRN
  Administered 2018-04-29 (×2): 1 mg via INTRAVENOUS
  Administered 2018-04-29 (×2): 2 mg via INTRAVENOUS

## 2018-04-29 MED ORDER — MEPERIDINE HCL 50 MG/ML IJ SOLN
INTRAMUSCULAR | Status: AC
  Filled 2018-04-29: qty 1

## 2018-04-29 MED ORDER — STERILE WATER FOR IRRIGATION IR SOLN
Status: DC | PRN
  Administered 2018-04-29: 2.5 mL

## 2018-04-29 MED ORDER — MEPERIDINE HCL 50 MG/ML IJ SOLN
INTRAMUSCULAR | Status: DC | PRN
  Administered 2018-04-29 (×2): 25 mg via INTRAVENOUS

## 2018-04-29 MED ORDER — SODIUM CHLORIDE 0.9 % IV SOLN
INTRAVENOUS | Status: DC
  Administered 2018-04-29: 14:00:00 via INTRAVENOUS

## 2018-04-29 NOTE — H&P (Signed)
[name] [name] is an 72 y.o. female.   Chief Complaint: Patient is here for colonoscopy. HPI: Patient is 72 year old Caucasian female with history of colonic adenomas and is here for surveillance colonoscopy.  She states she is overdue for procedure.  She states she had a bout of mild diarrhea after she took prednisone for her back pain and she did notice blood on one occasion.  She has not experienced frank bleeding or abdominal pain.  She also complains of intermittent leakage despite having formed stools daily.  Leakage started about 6 months ago and is intermittent. She does not take aspirin.  She takes ibuprofen on as-needed basis. Family history significant for CRC and maternal aunt who was in her 97s at the time of diagnosis.  She died while she was in her 62s.  Past Medical History:  Diagnosis Date  . Anxiety   . GERD (gastroesophageal reflux disease)   . Thyroid disease     Past Surgical History:  Procedure Laterality Date  . ABDOMINAL HYSTERECTOMY    . BLADDER REPAIR    . DILATION AND CURETTAGE OF UTERUS     times 4  . I&D EXTREMITY  06/28/2012   Procedure: MINOR IRRIGATION AND DEBRIDEMENT EXTREMITY;  Surgeon: Cammie Sickle., MD;  Location: [facility];  Service: Orthopedics;  Laterality: Right;  Debride Mucoid Cyst, Debride Joint     Family History  Problem Relation Age of Onset  . Cancer Father    Social History:  reports that she has never smoked. She has never used smokeless tobacco. She reports that she does not drink alcohol or use drugs.  Allergies:  Allergies  Allergen Reactions  . Prednisone Other (See Comments)    Rectal bleeding  . Amoxicillin-Pot Clavulanate Hives and Rash  . Ciprofloxacin Rash    Medications Prior to Admission  Medication Sig Dispense Refill  . cyclobenzaprine (FLEXERIL) 10 MG tablet Take 10 mg by mouth 3 (three) times daily as needed for muscle spasms.    Marland Kitchen HYDROcodone-acetaminophen (NORCO/VICODIN) 5-325 MG tablet Take  1 tablet by mouth every 4 (four) hours as needed. (Patient taking differently: Take 1 tablet by mouth every 4 (four) hours as needed (pain). ) 12 tablet 0  . ibuprofen (ADVIL,MOTRIN) 200 MG tablet Take 200 mg by mouth every 6 (six) hours as needed (pain).     Marland Kitchen levothyroxine (SYNTHROID, LEVOTHROID) 88 MCG tablet Take 88 mcg by mouth daily before breakfast.    . omeprazole (PRILOSEC) 20 MG capsule Take 1 capsule (20 mg total) by mouth daily. 30 capsule 1    No results found for this or any previous visit (from the past 48 hour(s)). No results found.  ROS  Blood pressure (!) 141/77, pulse (!) 57, temperature 97.7 F (36.5 C), temperature source Oral, resp. rate 17, SpO2 99 %. Physical Exam  Constitutional: She appears well-developed and well-nourished.  HENT:  Mouth/Throat: Oropharynx is clear and moist.  Eyes: Conjunctivae are normal. No scleral icterus.  Neck: No thyromegaly present.  Cardiovascular: Normal rate, regular rhythm and normal heart sounds.  No murmur heard. Respiratory: Effort normal and breath sounds normal.  GI: Soft. She exhibits no distension and no mass. There is no tenderness.  Musculoskeletal: She exhibits no edema.  Lymphadenopathy:    She has no cervical adenopathy.  Neurological: She is alert.  Skin: Skin is warm and dry.     Assessment/Plan History of colonic adenomas. Fecal seepage and hematochezia possibly due to hemorrhoids. Surveillance colonoscopy.  Hildred Laser, MD 04/29/2018, 2:52 PM

## 2018-04-29 NOTE — Op Note (Signed)
[facility] Patient Name: [name] [name] Procedure Date: 04/29/2018 2:17 PM MRN: [id number] Date of Birth: [birth year] Attending MD: Hildred Laser , MD CSN: [id number] Age: 72 Admit Type: Outpatient Procedure:                Colonoscopy Indications:              High risk colon cancer surveillance: Personal                            history of colonic polyps Providers:                Hildred Laser, MD, Janeece Riggers, RN, Gwynneth Albright RN, RN Referring MD:             Curlene Labrum, MD Medicines:                Meperidine 50 mg IV, Midazolam 6 mg IV Complications:            No immediate complications. Estimated Blood Loss:     Estimated blood loss was minimal. Procedure:                Pre-Anesthesia Assessment:                           - Prior to the procedure, a History and Physical                            was performed, and patient medications and                            allergies were reviewed. The patient's tolerance of                            previous anesthesia was also reviewed. The risks                            and benefits of the procedure and the sedation                            options and risks were discussed with the patient.                            All questions were answered, and informed consent                            was obtained. Prior Anticoagulants: The patient                            last took ibuprofen 7 days prior to the procedure.                            ASA Grade Assessment: II - A patient with mild  systemic disease. After reviewing the risks and                            benefits, the patient was deemed in satisfactory                            condition to undergo the procedure.                           After obtaining informed consent, the colonoscope                            was passed under direct vision. Throughout the                            procedure,  the patient's blood pressure, pulse, and                            oxygen saturations were monitored continuously. The                            [id number]-H190DL ([id number]) scope was introduced through                            the anus and advanced to the the cecum, identified                            by appendiceal orifice and ileocecal valve. The                            colonoscopy was performed without difficulty. The                            patient tolerated the procedure well. The quality                            of the bowel preparation was good. The ileocecal                            valve, appendiceal orifice, and rectum were                            photographed. Scope In: 3:02:10 PM Scope Out: 3:17:20 PM Scope Withdrawal Time: 0 hours 8 minutes 57 seconds  Total Procedure Duration: 0 hours 15 minutes 10 seconds  Findings:      The perianal and digital rectal examinations were normal.      A small polyp was found in the proximal ascending colon. The polyp was       sessile. Biopsies were taken with a cold forceps for histology.      A few small and medium-mouthed diverticula were found in the sigmoid       colon.      External and internal hemorrhoids were found during retroflexion. The       hemorrhoids were small. Impression:               -  One small polyp in the proximal ascending colon.                            Biopsied.                           - Diverticulosis in the sigmoid colon.                           - External and internal hemorrhoids. Moderate Sedation:      Moderate (conscious) sedation was administered by the endoscopy nurse       and supervised by the endoscopist. The following parameters were       monitored: oxygen saturation, heart rate, blood pressure, CO2       capnography and response to care. Total physician intraservice time was       20 minutes. Recommendation:           - Patient has a contact number available for                             emergencies. The signs and symptoms of potential                            delayed complications were discussed with the                            patient. Return to normal activities tomorrow.                            Written discharge instructions were provided to the                            patient.                           - High fiber diet today.                           - Continue present medications.                           - Benefiber 4 g po qhs.                           - Kegel exercise as discussed.                           - No aspirin, ibuprofen, naproxen, or other                            non-steroidal anti-inflammatory drugs for 1 day.                           - Await pathology results.                           - Repeat colonoscopy is  recommended. The                            colonoscopy date will be determined after pathology                            results from today's exam become available for                            review. Procedure Code(s):        --- Professional ---                           [phone], Colonoscopy, flexible; with biopsy, single                            or multiple                           G0500, Moderate sedation services provided by the                            same physician or other qualified health care                            professional performing a gastrointestinal                            endoscopic service that sedation supports,                            requiring the presence of an independent trained                            observer to assist in the monitoring of the                            patient's level of consciousness and physiological                            status; initial 15 minutes of intra-service time;                            patient age 34 years or older (additional time may                            be reported with [phone], as appropriate) Diagnosis Code(s):        ---  Professional ---                           Z86.010, Personal history of colonic polyps                           D12.2, Benign neoplasm of ascending colon  K64.8, Other hemorrhoids                           K57.30, Diverticulosis of large intestine without                            perforation or abscess without bleeding CPT copyright 2017 American Medical Association. All rights reserved. The codes documented in this report are preliminary and upon coder review may  be revised to meet current compliance requirements. Hildred Laser, MD Hildred Laser, MD 04/29/2018 3:25:44 PM This report has been signed electronically. Number of Addenda: 0

## 2018-04-29 NOTE — Discharge Instructions (Signed)
No aspirin or NSAIDs for 24 hours. Resume usual medications as before. High-fiber diet. Benefiber or equivalent 4 g by mouth daily at bedtime. No driving for 24 hours. Physician will call with biopsy results. Keep written record as to frequency of fecal seepage for the next 2 months..   Colonoscopy, Adult, Care After This sheet gives you information about how to care for yourself after your procedure. Your health care provider may also give you more specific instructions. If you have problems or questions, contact your health care provider. What can I expect after the procedure? After the procedure, it is common to have:  A small amount of blood in your stool for 24 hours after the procedure.  Some gas.  Mild abdominal cramping or bloating.  Follow these instructions at home: General instructions   For the first 24 hours after the procedure: ? Do not drive or use machinery. ? Do not sign important documents. ? Do not drink alcohol. ? Do your regular daily activities at a slower pace than normal. ? Eat soft, easy-to-digest foods. ? Rest often.  Take over-the-counter or prescription medicines only as told by your health care provider.  It is up to you to get the results of your procedure. Ask your health care provider, or the department performing the procedure, when your results will be ready. Relieving cramping and bloating  Try walking around when you have cramps or feel bloated.  Apply heat to your abdomen as told by your health care provider. Use a heat source that your health care provider recommends, such as a moist heat pack or a heating pad. ? Place a towel between your skin and the heat source. ? Leave the heat on for 20-30 minutes. ? Remove the heat if your skin turns bright red. This is especially important if you are unable to feel pain, heat, or cold. You may have a greater risk of getting burned. Eating and drinking  Drink enough fluid to keep your urine  clear or pale yellow.  Resume your normal diet as instructed by your health care provider. Avoid heavy or fried foods that are hard to digest.  Avoid drinking alcohol for as long as instructed by your health care provider. Contact a health care provider if:  You have blood in your stool 2-3 days after the procedure. Get help right away if:  You have more than a small spotting of blood in your stool.  You pass large blood clots in your stool.  Your abdomen is swollen.  You have nausea or vomiting.  You have a fever.  You have increasing abdominal pain that is not relieved with medicine. This information is not intended to replace advice given to you by your health care provider. Make sure you discuss any questions you have with your health care provider. Document Released: 03/14/2004 Document Revised: 04/24/2016 Document Reviewed: 10/12/2015 Elsevier Interactive Patient Education  2018 [employer].   Colon Polyps Polyps are tissue growths inside the body. Polyps can grow in many places, including the large intestine (colon). A polyp may be a round bump or a mushroom-shaped growth. You could have one polyp or several. Most colon polyps are noncancerous (benign). However, some colon polyps can become cancerous over time. What are the causes? The exact cause of colon polyps is not known. What increases the risk? This condition is more likely to develop in people who:  Have a family history of colon cancer or colon polyps.  Are older than 50 or  older than 96 if they are African American.  Have inflammatory bowel disease, such as ulcerative colitis or Crohn disease.  Are overweight.  Smoke cigarettes.  Do not get enough exercise.  Drink too much alcohol.  Eat a diet that is: ? High in fat and red meat. ? Low in fiber.  Had childhood cancer that was treated with abdominal radiation.  What are the signs or symptoms? Most polyps do not cause symptoms. If you have  symptoms, they may include:  Blood coming from your rectum when having a bowel movement.  Blood in your stool.The stool may look dark red or black.  A change in bowel habits, such as constipation or diarrhea.  How is this diagnosed? This condition is diagnosed with a colonoscopy. This is a procedure that uses a lighted, flexible scope to look at the inside of your colon. How is this treated? Treatment for this condition involves removing any polyps that are found. Those polyps will then be tested for cancer. If cancer is found, your health care provider will talk to you about options for colon cancer treatment. Follow these instructions at home: Diet  Eat plenty of fiber, such as fruits, vegetables, and whole grains.  Eat foods that are high in calcium and vitamin D, such as milk, cheese, yogurt, eggs, liver, fish, and broccoli.  Limit foods high in fat, red meats, and processed meats, such as hot dogs, sausage, bacon, and lunch meats.  Maintain a healthy weight, or lose weight if recommended by your health care provider. General instructions  Do not smoke cigarettes.  Do not drink alcohol excessively.  Keep all follow-up visits as told by your health care provider. This is important. This includes keeping regularly scheduled colonoscopies. Talk to your health care provider about when you need a colonoscopy.  Exercise every day or as told by your health care provider. Contact a health care provider if:  You have new or worsening bleeding during a bowel movement.  You have new or increased blood in your stool.  You have a change in bowel habits.  You unexpectedly lose weight. This information is not intended to replace advice given to you by your health care provider. Make sure you discuss any questions you have with your health care provider. Document Released: 04/26/2004 Document Revised: 01/06/2016 Document Reviewed: 06/21/2015 Elsevier Interactive Patient Education   [employer].   Diverticulosis Diverticulosis is a condition that develops when small pouches (diverticula) form in the wall of the large intestine (colon). The colon is where water is absorbed and stool is formed. The pouches form when the inside layer of the colon pushes through weak spots in the outer layers of the colon. You may have a few pouches or many of them. What are the causes? The cause of this condition is not known. What increases the risk? The following factors may make you more likely to develop this condition:  Being older than age 11. Your risk for this condition increases with age. Diverticulosis is rare among people younger than age 54. By age 55, many people have it.  Eating a low-fiber diet.  Having frequent constipation.  Being overweight.  Not getting enough exercise.  Smoking.  Taking over-the-counter pain medicines, like aspirin and ibuprofen.  Having a family history of diverticulosis.  What are the signs or symptoms? In most people, there are no symptoms of this condition. If you do have symptoms, they may include:  Bloating.  Cramps in the abdomen.  Constipation  or diarrhea.  Pain in the lower left side of the abdomen.  How is this diagnosed? This condition is most often diagnosed during an exam for other colon problems. Because diverticulosis usually has no symptoms, it often cannot be diagnosed independently. This condition may be diagnosed by:  Using a flexible scope to examine the colon (colonoscopy).  Taking an X-ray of the colon after dye has been put into the colon (barium enema).  Doing a CT scan.  How is this treated? You may not need treatment for this condition if you have never developed an infection related to diverticulosis. If you have had an infection before, treatment may include:  Eating a high-fiber diet. This may include eating more fruits, vegetables, and grains.  Taking a fiber supplement.  Taking a live  bacteria supplement (probiotic).  Taking medicine to relax your colon.  Taking antibiotic medicines.  Follow these instructions at home:  Drink 6-8 glasses of water or more each day to prevent constipation.  Try not to strain when you have a bowel movement.  If you have had an infection before: ? Eat more fiber as directed by your health care provider or your diet and nutrition specialist (dietitian). ? Take a fiber supplement or probiotic, if your health care provider approves.  Take over-the-counter and prescription medicines only as told by your health care provider.  If you were prescribed an antibiotic, take it as told by your health care provider. Do not stop taking the antibiotic even if you start to feel better.  Keep all follow-up visits as told by your health care provider. This is important. Contact a health care provider if:  You have pain in your abdomen.  You have bloating.  You have cramps.  You have not had a bowel movement in 3 days. Get help right away if:  Your pain gets worse.  Your bloating becomes very bad.  You have a fever or chills, and your symptoms suddenly get worse.  You vomit.  You have bowel movements that are bloody or black.  You have bleeding from your rectum. Summary  Diverticulosis is a condition that develops when small pouches (diverticula) form in the wall of the large intestine (colon).  You may have a few pouches or many of them.  This condition is most often diagnosed during an exam for other colon problems.  If you have had an infection related to diverticulosis, treatment may include increasing the fiber in your diet, taking supplements, or taking medicines. This information is not intended to replace advice given to you by your health care provider. Make sure you discuss any questions you have with your health care provider. Document Released: 04/27/2004 Document Revised: 06/19/2016 Document Reviewed:  06/19/2016 Elsevier Interactive Patient Education  2017 [employer].   Hemorrhoids Hemorrhoids are swollen veins in and around the rectum or anus. Hemorrhoids can cause pain, itching, or bleeding. Most of the time, they do not cause serious problems. They usually get better with diet changes, lifestyle changes, and other home treatments. Follow these instructions at home: Eating and drinking  Eat foods that have fiber, such as whole grains, beans, nuts, fruits, and vegetables. Ask your doctor about taking products that have added fiber (fibersupplements).  Drink enough fluid to keep your pee (urine) clear or pale yellow. For Pain and Swelling  Take a warm-water bath (sitz bath) for 20 minutes to ease pain. Do this 3-4 times a day.  If directed, put ice on the painful area. It  may be helpful to use ice between your warm baths. ? Put ice in a plastic bag. ? Place a towel between your skin and the bag. ? Leave the ice on for 20 minutes, 2-3 times a day. General instructions  Take over-the-counter and prescription medicines only as told by your doctor. ? Medicated creams and medicines that are inserted into the anus (suppositories) may be used or applied as told.  Exercise often.  Go to the bathroom when you have the urge to poop (to have a bowel movement). Do not wait.  Avoid pushing too hard (straining) when you poop.  Keep the butt area dry and clean. Use wet toilet paper or moist paper towels.  Do not sit on the toilet for a long time. Contact a doctor if:  You have any of these: ? Pain and swelling that do not get better with treatment or medicine. ? Bleeding that will not stop. ? Trouble pooping or you cannot poop. ? Pain or swelling outside the area of the hemorrhoids. This information is not intended to replace advice given to you by your health care provider. Make sure you discuss any questions you have with your health care provider. Document Released: 05/09/2008  Document Revised: 01/06/2016 Document Reviewed: 04/14/2015 Elsevier Interactive Patient Education  2018 [employer].   Moderate Conscious Sedation, Adult, Care After These instructions provide you with information about caring for yourself after your procedure. Your health care provider may also give you more specific instructions. Your treatment has been planned according to current medical practices, but problems sometimes occur. Call your health care provider if you have any problems or questions after your procedure. What can I expect after the procedure? After your procedure, it is common:  To feel sleepy for several hours.  To feel clumsy and have poor balance for several hours.  To have poor judgment for several hours.  To vomit if you eat too soon.  Follow these instructions at home: For at least 24 hours after the procedure:   Do not: ? Participate in activities where you could fall or become injured. ? Drive. ? Use heavy machinery. ? Drink alcohol. ? Take sleeping pills or medicines that cause drowsiness. ? Make important decisions or sign legal documents. ? Take care of children on your own.  Rest. Eating and drinking  Follow the diet recommended by your health care provider.  If you vomit: ? Drink water, juice, or soup when you can drink without vomiting. ? Make sure you have little or no nausea before eating solid foods. General instructions  Have a responsible adult stay with you until you are awake and alert.  Take over-the-counter and prescription medicines only as told by your health care provider.  If you smoke, do not smoke without supervision.  Keep all follow-up visits as told by your health care provider. This is important. Contact a health care provider if:  You keep feeling nauseous or you keep vomiting.  You feel light-headed.  You develop a rash.  You have a fever. Get help right away if:  You have trouble breathing. This  information is not intended to replace advice given to you by your health care provider. Make sure you discuss any questions you have with your health care provider. Document Released: 05/21/2013 Document Revised: 01/03/2016 Document Reviewed: 11/20/2015 Elsevier Interactive Patient Education  [employer].

## 2018-05-03 ENCOUNTER — Encounter (HOSPITAL_COMMUNITY): Payer: Self-pay | Admitting: Internal Medicine

## 2018-05-03 DIAGNOSIS — E538 Deficiency of other specified B group vitamins: Secondary | ICD-10-CM | POA: Diagnosis not present

## 2018-05-17 DIAGNOSIS — E538 Deficiency of other specified B group vitamins: Secondary | ICD-10-CM | POA: Diagnosis not present

## 2018-06-03 DIAGNOSIS — N3001 Acute cystitis with hematuria: Secondary | ICD-10-CM | POA: Diagnosis not present

## 2018-06-03 DIAGNOSIS — R5383 Other fatigue: Secondary | ICD-10-CM | POA: Diagnosis not present

## 2018-06-03 DIAGNOSIS — R69 Illness, unspecified: Secondary | ICD-10-CM | POA: Diagnosis not present

## 2018-06-03 DIAGNOSIS — Z6827 Body mass index (BMI) 27.0-27.9, adult: Secondary | ICD-10-CM | POA: Diagnosis not present

## 2018-06-17 DIAGNOSIS — E538 Deficiency of other specified B group vitamins: Secondary | ICD-10-CM | POA: Diagnosis not present

## 2018-06-27 DIAGNOSIS — Z6827 Body mass index (BMI) 27.0-27.9, adult: Secondary | ICD-10-CM | POA: Diagnosis not present

## 2018-06-27 DIAGNOSIS — S39012A Strain of muscle, fascia and tendon of lower back, initial encounter: Secondary | ICD-10-CM | POA: Diagnosis not present

## 2018-06-27 DIAGNOSIS — M545 Low back pain: Secondary | ICD-10-CM | POA: Diagnosis not present

## 2018-07-01 DIAGNOSIS — I10 Essential (primary) hypertension: Secondary | ICD-10-CM | POA: Diagnosis not present

## 2018-07-01 DIAGNOSIS — E538 Deficiency of other specified B group vitamins: Secondary | ICD-10-CM | POA: Diagnosis not present

## 2018-07-01 DIAGNOSIS — E559 Vitamin D deficiency, unspecified: Secondary | ICD-10-CM | POA: Diagnosis not present

## 2018-07-01 DIAGNOSIS — K219 Gastro-esophageal reflux disease without esophagitis: Secondary | ICD-10-CM | POA: Diagnosis not present

## 2018-07-01 DIAGNOSIS — R001 Bradycardia, unspecified: Secondary | ICD-10-CM | POA: Diagnosis not present

## 2018-07-01 DIAGNOSIS — E039 Hypothyroidism, unspecified: Secondary | ICD-10-CM | POA: Diagnosis not present

## 2018-07-01 DIAGNOSIS — E782 Mixed hyperlipidemia: Secondary | ICD-10-CM | POA: Diagnosis not present

## 2018-07-03 DIAGNOSIS — D126 Benign neoplasm of colon, unspecified: Secondary | ICD-10-CM | POA: Diagnosis not present

## 2018-07-03 DIAGNOSIS — K219 Gastro-esophageal reflux disease without esophagitis: Secondary | ICD-10-CM | POA: Diagnosis not present

## 2018-07-03 DIAGNOSIS — E039 Hypothyroidism, unspecified: Secondary | ICD-10-CM | POA: Diagnosis not present

## 2018-07-03 DIAGNOSIS — R5383 Other fatigue: Secondary | ICD-10-CM | POA: Diagnosis not present

## 2018-07-03 DIAGNOSIS — E782 Mixed hyperlipidemia: Secondary | ICD-10-CM | POA: Diagnosis not present

## 2018-07-03 DIAGNOSIS — Z6827 Body mass index (BMI) 27.0-27.9, adult: Secondary | ICD-10-CM | POA: Diagnosis not present

## 2018-07-07 ENCOUNTER — Encounter (HOSPITAL_COMMUNITY): Payer: Self-pay | Admitting: Emergency Medicine

## 2018-07-07 ENCOUNTER — Other Ambulatory Visit: Payer: Self-pay

## 2018-07-07 ENCOUNTER — Emergency Department (HOSPITAL_COMMUNITY)
Admission: EM | Admit: 2018-07-07 | Discharge: 2018-07-08 | Disposition: A | Discharge status: Home / Self Care | Payer: Medicare HMO | Attending: Emergency Medicine | Admitting: Emergency Medicine

## 2018-07-07 DIAGNOSIS — M545 Low back pain, unspecified: Secondary | ICD-10-CM

## 2018-07-07 DIAGNOSIS — K5730 Diverticulosis of large intestine without perforation or abscess without bleeding: Secondary | ICD-10-CM | POA: Diagnosis not present

## 2018-07-07 DIAGNOSIS — R109 Unspecified abdominal pain: Secondary | ICD-10-CM | POA: Diagnosis not present

## 2018-07-07 DIAGNOSIS — M62830 Muscle spasm of back: Secondary | ICD-10-CM | POA: Insufficient documentation

## 2018-07-07 DIAGNOSIS — Z8673 Personal history of transient ischemic attack (TIA), and cerebral infarction without residual deficits: Secondary | ICD-10-CM | POA: Insufficient documentation

## 2018-07-07 DIAGNOSIS — Z79899 Other long term (current) drug therapy: Secondary | ICD-10-CM | POA: Insufficient documentation

## 2018-07-07 MED ORDER — MORPHINE SULFATE (PF) 4 MG/ML IV SOLN
4.0000 mg | Freq: Once | INTRAVENOUS | Status: AC
  Administered 2018-07-08: 4 mg via INTRAVENOUS
  Filled 2018-07-07: qty 1

## 2018-07-07 NOTE — ED Triage Notes (Signed)
Pt with c/o L. Lower back pain. Denies injury.

## 2018-07-08 ENCOUNTER — Emergency Department (HOSPITAL_COMMUNITY): Payer: Medicare HMO

## 2018-07-08 ENCOUNTER — Encounter (HOSPITAL_COMMUNITY): Payer: Self-pay | Admitting: Emergency Medicine

## 2018-07-08 ENCOUNTER — Other Ambulatory Visit: Payer: Self-pay

## 2018-07-08 ENCOUNTER — Emergency Department (HOSPITAL_COMMUNITY)
Admission: EM | Admit: 2018-07-08 | Discharge: 2018-07-08 | Disposition: A | Discharge status: Home / Self Care | Payer: Medicare HMO | Source: Home / Self Care | Attending: Emergency Medicine | Admitting: Emergency Medicine

## 2018-07-08 DIAGNOSIS — M545 Low back pain, unspecified: Secondary | ICD-10-CM

## 2018-07-08 DIAGNOSIS — K5730 Diverticulosis of large intestine without perforation or abscess without bleeding: Secondary | ICD-10-CM | POA: Diagnosis not present

## 2018-07-08 DIAGNOSIS — R109 Unspecified abdominal pain: Secondary | ICD-10-CM | POA: Diagnosis not present

## 2018-07-08 DIAGNOSIS — M62830 Muscle spasm of back: Secondary | ICD-10-CM | POA: Diagnosis not present

## 2018-07-08 DIAGNOSIS — Z79899 Other long term (current) drug therapy: Secondary | ICD-10-CM | POA: Diagnosis not present

## 2018-07-08 DIAGNOSIS — Z8673 Personal history of transient ischemic attack (TIA), and cerebral infarction without residual deficits: Secondary | ICD-10-CM | POA: Diagnosis not present

## 2018-07-08 LAB — CBC WITH DIFFERENTIAL/PLATELET
ABS IMMATURE GRANULOCYTES: 0.02 10*3/uL (ref 0.00–0.07)
BASOS ABS: 0 10*3/uL (ref 0.0–0.1)
Basophils Relative: 1 %
EOS PCT: 3 %
Eosinophils Absolute: 0.2 10*3/uL (ref 0.0–0.5)
HEMATOCRIT: 44.1 % (ref 36.0–46.0)
HEMOGLOBIN: 13.7 g/dL (ref 12.0–15.0)
IMMATURE GRANULOCYTES: 0 %
LYMPHS PCT: 35 %
Lymphs Abs: 2.4 10*3/uL (ref 0.7–4.0)
MCH: 27.8 pg (ref 26.0–34.0)
MCHC: 31.1 g/dL (ref 30.0–36.0)
MCV: 89.6 fL (ref 80.0–100.0)
Monocytes Absolute: 0.4 10*3/uL (ref 0.1–1.0)
Monocytes Relative: 6 %
NEUTROS ABS: 3.9 10*3/uL (ref 1.7–7.7)
NRBC: 0 % (ref 0.0–0.2)
Neutrophils Relative %: 55 %
Platelets: 244 10*3/uL (ref 150–400)
RBC: 4.92 MIL/uL (ref 3.87–5.11)
RDW: 13 % (ref 11.5–15.5)
WBC: 6.9 10*3/uL (ref 4.0–10.5)

## 2018-07-08 LAB — URINALYSIS, ROUTINE W REFLEX MICROSCOPIC
Bacteria, UA: NONE SEEN
Bilirubin Urine: NEGATIVE
Glucose, UA: NEGATIVE mg/dL
Ketones, ur: NEGATIVE mg/dL
LEUKOCYTES UA: NEGATIVE
Nitrite: NEGATIVE
Protein, ur: NEGATIVE mg/dL
SPECIFIC GRAVITY, URINE: 1.02 (ref 1.005–1.030)
pH: 6 (ref 5.0–8.0)

## 2018-07-08 LAB — BASIC METABOLIC PANEL
ANION GAP: 6 (ref 5–15)
BUN: 15 mg/dL (ref 8–23)
CHLORIDE: 108 mmol/L (ref 98–111)
CO2: 27 mmol/L (ref 22–32)
Calcium: 9.2 mg/dL (ref 8.9–10.3)
Creatinine, Ser: 0.79 mg/dL (ref 0.44–1.00)
Glucose, Bld: 122 mg/dL — ABNORMAL HIGH (ref 70–99)
POTASSIUM: 3.8 mmol/L (ref 3.5–5.1)
Sodium: 141 mmol/L (ref 135–145)

## 2018-07-08 MED ORDER — OXYCODONE-ACETAMINOPHEN 5-325 MG PO TABS: ORAL_TABLET | ORAL | 0 refills | Status: DC

## 2018-07-08 MED ORDER — HYDROCODONE-ACETAMINOPHEN 5-325 MG PO TABS
1.0000 | ORAL_TABLET | Freq: Once | ORAL | Status: AC
  Administered 2018-07-08: 1 via ORAL
  Filled 2018-07-08: qty 1

## 2018-07-08 MED ORDER — LIDOCAINE 4 % EX PTCH: 1.0000 | MEDICATED_PATCH | Freq: Two times a day (BID) | CUTANEOUS | 0 refills | Status: DC

## 2018-07-08 MED ORDER — IOPAMIDOL (ISOVUE-300) INJECTION 61%: 100.0000 mL | Freq: Once | INTRAVENOUS | Status: DC | PRN

## 2018-07-08 MED ORDER — METHOCARBAMOL 500 MG PO TABS: 500.0000 mg | ORAL_TABLET | Freq: Four times a day (QID) | ORAL | 0 refills | Status: DC | PRN

## 2018-07-08 MED ORDER — OXYCODONE-ACETAMINOPHEN 5-325 MG PO TABS
1.0000 | ORAL_TABLET | Freq: Once | ORAL | Status: AC
  Administered 2018-07-08: 1 via ORAL
  Filled 2018-07-08: qty 1

## 2018-07-08 MED ORDER — ACETAMINOPHEN ER 650 MG PO TBCR: 650.0000 mg | EXTENDED_RELEASE_TABLET | Freq: Three times a day (TID) | ORAL | 0 refills | Status: DC

## 2018-07-08 NOTE — Discharge Instructions (Signed)
Take the prescriptions as directed. May also take over the counter ibuprofen, as directed on packaging, as needed for discomfort. Do not take extra tylenol if you are taking the percocet because it already has tylenol in it. Apply moist heat or ice to the area(s) of discomfort, for 15 minutes at a time, several times per day for the next few days.  Do not fall asleep on a heating or ice pack.  Call your regular medical doctor tomorrow to schedule a follow up appointment this week.  Return to the Emergency Department immediately if worsening.

## 2018-07-08 NOTE — Discharge Instructions (Addendum)
We saw in the ER for back pain.  It appears that your pain has resolved after you received IV morphine.  The labs overall are reassuring and so is a urine test.  We are not quite sure what is the underlying cause for your pain, however at this time it does not appear that there is an acute emergent process going on.  Please return to the ER if you start having worsening of your pain or start developing fevers, vomiting.

## 2018-07-08 NOTE — ED Provider Notes (Addendum)
[facility] EMERGENCY DEPARTMENT Provider Note   CSN: [id number] Arrival date & time: 07/07/18  2145     History   Chief Complaint Chief Complaint  Patient presents with  . Back Pain    HPI [name] [name] is a 72 y.o. female.  HPI  72 year old female with history of GERD, anxiety and thyroid disease comes in with chief complaint of back pain.  Patient states that she started having left-sided flank pain about 8 hours ago.  The pain has been intermittent, but quite frequent and severe.  She has no history of similar pain in the past.  Patient denies any associated burning with urination, blood in the urine, urinary frequency, abdominal pain, nausea, vomiting or diarrhea.  She has no cough.  Patient does not recall any specific evoking factors and feels that the pain is internal and not musculoskeletal.  Past Medical History:  Diagnosis Date  . Anxiety   . GERD (gastroesophageal reflux disease)   . Thyroid disease     Patient Active Problem List   Diagnosis Date Noted  . Fecal smearing 04/24/2018  . Rectal bleeding 04/24/2018  . Tubular adenoma 04/24/2018  . Syncope 06/04/2015  . TIA (transient ischemic attack) 06/04/2015  . Faintness   . NECK PAIN 06/24/2008  . SHOULDER PAIN, LEFT 05/04/2008  . IMPINGEMENT SYNDROME 05/04/2008    Past Surgical History:  Procedure Laterality Date  . ABDOMINAL HYSTERECTOMY    . BLADDER REPAIR    . COLONOSCOPY N/A 04/29/2018   Procedure: COLONOSCOPY;  Surgeon: Rogene Houston, MD;  [facility];  Service: Endoscopy;  Laterality: N/A;  2:35  . DILATION AND CURETTAGE OF UTERUS     times 4  . I&D EXTREMITY  06/28/2012   Procedure: MINOR IRRIGATION AND DEBRIDEMENT EXTREMITY;  Surgeon: Cammie Sickle., MD;  Location: [place];  Service: Orthopedics;  Laterality: Right;  Debride Mucoid Cyst, Debride Joint   . POLYPECTOMY  04/29/2018   Procedure: POLYPECTOMY INTESTINAL;  Surgeon: Rogene Houston, MD;  Location:  [facility];  Service: Endoscopy;;     OB History    Gravida  8   Para  2   Term      Preterm      AB  6   Living        SAB  6   TAB      Ectopic      Multiple      Live Births               Home Medications    Prior to Admission medications   Medication Sig Start Date End Date Taking? Authorizing Provider  acetaminophen (TYLENOL 8 HOUR) 650 MG CR tablet Take 1 tablet (650 mg total) by mouth every 8 (eight) hours. 07/08/18   Varney Biles, MD  cyclobenzaprine (FLEXERIL) 10 MG tablet Take 10 mg by mouth 3 (three) times daily as needed for muscle spasms.    [provider]  HYDROcodone-acetaminophen (NORCO/VICODIN) 5-325 MG tablet Take 1 tablet by mouth every 4 (four) hours as needed. Patient taking differently: Take 1 tablet by mouth every 4 (four) hours as needed (pain).  04/22/18   Lily Kocher, PA-C  ibuprofen (ADVIL,MOTRIN) 200 MG tablet Take 200 mg by mouth every 6 (six) hours as needed (pain).     [provider]  levothyroxine (SYNTHROID, LEVOTHROID) 88 MCG tablet Take 88 mcg by mouth daily before breakfast.    [provider]  Lidocaine 4 % PTCH Apply 1 patch topically 2 (two) times daily. 07/08/18   Varney Biles, MD  omeprazole (PRILOSEC) 20 MG capsule Take 1 capsule (20 mg total) by mouth daily. 06/05/15   Barton Dubois, MD    Family History Family History  Problem Relation Age of Onset  . Cancer Father     Social History Social History   Tobacco Use  . Smoking status: Never Smoker  . Smokeless tobacco: Never Used  Substance Use Topics  . Alcohol use: No    Alcohol/week: 0.0 standard drinks  . Drug use: No     Allergies   Prednisone; Amoxicillin-pot clavulanate; and Ciprofloxacin   Review of Systems Review of Systems  Constitutional: Positive for activity change.  Respiratory: Negative for cough and shortness of breath.   Cardiovascular: Negative for chest pain.  Gastrointestinal: Negative for  abdominal pain, diarrhea, nausea and vomiting.  Genitourinary: Positive for flank pain.  All other systems reviewed and are negative.    Physical Exam Updated Vital Signs BP (!) 170/86 (BP Location: Right Arm)   Pulse 65   Temp 97.7 F (36.5 C) (Oral)   Resp 17   Ht 5\' 5"  (1.651 m)   Wt 73.5 kg   SpO2 98%   BMI 26.96 kg/m   Physical Exam  Constitutional: She is oriented to person, place, and time. She appears well-developed.  HENT:  Head: Normocephalic and atraumatic.  Eyes: EOM are normal.  Neck: Normal range of motion. Neck supple.  Cardiovascular: Normal rate.  Pulmonary/Chest: Effort normal.  Abdominal: Soft. Bowel sounds are normal. There is no tenderness.  Musculoskeletal:  No reproducible tenderness over the left flank region.  There is no midline L-spine tenderness and there is no palpable spasm  Neurological: She is alert and oriented to person, place, and time.  Skin: Skin is warm and dry.  Nursing note and vitals reviewed.    ED Treatments / Results  Labs (all labs ordered are listed, but only abnormal results are displayed) Labs Reviewed  URINALYSIS, ROUTINE W REFLEX MICROSCOPIC - Abnormal; Notable for the following components:      Result Value   Hgb urine dipstick SMALL (*)    All other components within normal limits  BASIC METABOLIC PANEL - Abnormal; Notable for the following components:   Glucose, Bld 122 (*)    All other components within normal limits  CBC WITH DIFFERENTIAL/PLATELET    EKG None  Radiology No results found.  Procedures Procedures (including critical care time)  Medications Ordered in ED Medications  iopamidol (ISOVUE-300) 61 % injection 100 mL (has no administration in time range)  morphine 4 MG/ML injection 4 mg (4 mg Intravenous Given 07/08/18 0004)     Initial Impression / Assessment and Plan / ED Course  I have reviewed the triage vital signs and the nursing notes.  Pertinent labs & imaging results that were  available during my care of the patient were reviewed by me and considered in my medical decision making (see chart for details).  Clinical Course as of Jul 08 148  Mon Jul 08, 2018  0147 Patient reassessed.  She reports that after receiving morphine she has been pain-free.  She is comfortable with wait and watch approach with her pain rather than getting CT scan right now.  Strict ER return precautions have been discussed and patient is comfortable with the plan of going home for now.   [AN]    Clinical Course User Index [AN] Varney Biles, MD  72 year old female comes in with chief complaint of back pain.  She is having left-sided flank pain, that is not reproducible and there is no specific precipitating factor associated with this pain.  According to the patient, pain appears to be internal in nature.  She has no UTI-like symptoms and there is no history of kidney stones.  We will start with basic labs, however likely we will be pursuing CT scan for this pain to rule out any infectious process or tumors.  Kidney stone is another possibility.   Final Clinical Impressions(s) / ED Diagnoses   Final diagnoses:  Acute left-sided low back pain without sciatica  Muscle spasm of back    ED Discharge Orders         Ordered    Lidocaine 4 % PTCH  2 times daily     07/08/18 0148    acetaminophen (TYLENOL 8 HOUR) 650 MG CR tablet  Every 8 hours     07/08/18 0148           Varney Biles, MD 07/08/18 0007    Varney Biles, MD 07/08/18 1610

## 2018-07-08 NOTE — ED Provider Notes (Signed)
[facility] EMERGENCY DEPARTMENT Provider Note   CSN: [id number] Arrival date & time: 07/08/18  1325     History   Chief Complaint Chief Complaint  Patient presents with  . Back Pain    HPI [name] [name] is a 72 y.o. female.  HPI  Pt was seen at 1525. Per pt, c/o gradual onset and persistence of constant left sided flank "pain" that began yesterday. Pt states she had similar pain 2 weeks ago, but it only lasted 3 days before spontaneously resolving. Pt states she was evaluated in the ED overnight last night, but states she received morphine, her pain improved, and she left without obtaining a suggested CT scan. Pt states the pain came back, so she came back to the ED "for the CT scan." Pt took motrin this morning. Denies abd pain, no N/V/D, no fevers, no rash, no dysuria/hematuria, no CP/SOB, no focal motor weakness, no tingling/numbness in extremities, no injury.      Past Medical History:  Diagnosis Date  . Anxiety   . GERD (gastroesophageal reflux disease)   . Thyroid disease     Patient Active Problem List   Diagnosis Date Noted  . Fecal smearing 04/24/2018  . Rectal bleeding 04/24/2018  . Tubular adenoma 04/24/2018  . Syncope 06/04/2015  . TIA (transient ischemic attack) 06/04/2015  . Faintness   . NECK PAIN 06/24/2008  . SHOULDER PAIN, LEFT 05/04/2008  . IMPINGEMENT SYNDROME 05/04/2008    Past Surgical History:  Procedure Laterality Date  . ABDOMINAL HYSTERECTOMY    . BLADDER REPAIR    . COLONOSCOPY N/A 04/29/2018   Procedure: COLONOSCOPY;  Surgeon: Rogene Houston, MD;  [facility];  Service: Endoscopy;  Laterality: N/A;  2:35  . DILATION AND CURETTAGE OF UTERUS     times 4  . I&D EXTREMITY  06/28/2012   Procedure: MINOR IRRIGATION AND DEBRIDEMENT EXTREMITY;  Surgeon: Cammie Sickle., MD;  Location: [place];  Service: Orthopedics;  Laterality: Right;  Debride Mucoid Cyst, Debride Joint   . POLYPECTOMY  04/29/2018   Procedure: POLYPECTOMY INTESTINAL;  Surgeon: Rogene Houston, MD;  [facility];  Service: Endoscopy;;     OB History    Gravida  8   Para  2   Term      Preterm      AB  6   Living        SAB  6   TAB      Ectopic      Multiple      Live Births               Home Medications    Prior to Admission medications   Medication Sig Start Date End Date Taking? Authorizing Provider  acetaminophen (TYLENOL 8 HOUR) 650 MG CR tablet Take 1 tablet (650 mg total) by mouth every 8 (eight) hours. 07/08/18   Varney Biles, MD  cyclobenzaprine (FLEXERIL) 10 MG tablet Take 10 mg by mouth 3 (three) times daily as needed for muscle spasms.    [provider]  HYDROcodone-acetaminophen (NORCO/VICODIN) 5-325 MG tablet Take 1 tablet by mouth every 4 (four) hours as needed. Patient taking differently: Take 1 tablet by mouth every 4 (four) hours as needed (pain).  04/22/18   Lily Kocher, PA-C  ibuprofen (ADVIL,MOTRIN) 200 MG tablet Take 200 mg by mouth every 6 (six) hours as needed (pain).     [provider]  levothyroxine (SYNTHROID, [place]) 88  MCG tablet Take 88 mcg by mouth daily before breakfast.    [provider]  Lidocaine 4 % PTCH Apply 1 patch topically 2 (two) times daily. 07/08/18   Varney Biles, MD  omeprazole (PRILOSEC) 20 MG capsule Take 1 capsule (20 mg total) by mouth daily. 06/05/15   Barton Dubois, MD    Family History Family History  Problem Relation Age of Onset  . Cancer Father     Social History Social History   Tobacco Use  . Smoking status: Never Smoker  . Smokeless tobacco: Never Used  Substance Use Topics  . Alcohol use: No    Alcohol/week: 0.0 standard drinks  . Drug use: No     Allergies   Prednisone; Amoxicillin-pot clavulanate; and Ciprofloxacin   Review of Systems Review of Systems ROS: Statement: All systems negative except as marked or noted in the HPI; Constitutional: Negative for fever  and chills. ; ; Eyes: Negative for eye pain, redness and discharge. ; ; ENMT: Negative for ear pain, hoarseness, nasal congestion, sinus pressure and sore throat. ; ; Cardiovascular: Negative for chest pain, palpitations, diaphoresis, dyspnea and peripheral edema. ; ; Respiratory: Negative for cough, wheezing and stridor. ; ; Gastrointestinal: Negative for nausea, vomiting, diarrhea, abdominal pain, blood in stool, hematemesis, jaundice and rectal bleeding. . ; ; Genitourinary: Negative for dysuria and hematuria. ; ; Musculoskeletal: +left back pain. Negative for neck pain. Negative for swelling and trauma.; ; Skin: Negative for pruritus, rash, abrasions, blisters, bruising and skin lesion.; ; Neuro: Negative for headache, lightheadedness and neck stiffness. Negative for weakness, altered level of consciousness, altered mental status, extremity weakness, paresthesias, involuntary movement, seizure and syncope.       Physical Exam Updated Vital Signs BP 137/88 (BP Location: Right Arm)   Pulse (!) 51   Temp 98 F (36.7 C) (Oral)   Resp 16   Ht 5\' 5"  (1.651 m)   Wt 77.1 kg   SpO2 100%   BMI 28.29 kg/m   Physical Exam 1530: Physical examination:  Nursing notes reviewed; Vital signs and O2 SAT reviewed;  Constitutional: Well developed, Well nourished, Well hydrated, In no acute distress; Head:  Normocephalic, atraumatic; Eyes: EOMI, PERRL, No scleral icterus; ENMT: Mouth and pharynx normal, Mucous membranes moist; Neck: Supple, Full range of motion, No lymphadenopathy; Cardiovascular: Regular rate and rhythm, No gallop; Respiratory: Breath sounds clear & equal bilaterally, No wheezes.  Speaking full sentences with ease, Normal respiratory effort/excursion; Chest: Nontender, Movement normal; Abdomen: Soft, Nontender, Nondistended, Normal bowel sounds; Genitourinary: No CVA tenderness; Spine:  No midline CS, TS, LS tenderness. +TTP left lumbar paraspinal muscles.;; Extremities: Peripheral pulses normal,  No tenderness, No edema, No calf edema or asymmetry.; Neuro: AA&Ox3, Major CN grossly intact.  Speech clear. No gross focal motor or sensory deficits in extremities. Climbs on and off chair easily by herself. Gait steady.; Skin: Color normal, Warm, Dry.   ED Treatments / Results  Labs (all labs ordered are listed, but only abnormal results are displayed)   EKG None  Radiology   Procedures Procedures (including critical care time)  Medications Ordered in ED Medications  HYDROcodone-acetaminophen (NORCO/VICODIN) 5-325 MG per tablet 1 tablet (1 tablet Oral Given 07/08/18 1541)     Initial Impression / Assessment and Plan / ED Course  I have reviewed the triage vital signs and the nursing notes.  Pertinent labs & imaging results that were available during my care of the patient were reviewed by me and considered in my medical decision  making (see chart for details).  MDM Reviewed: previous chart, nursing note and vitals Reviewed previous: labs Interpretation: CT scan     Results for orders placed or performed during the hospital encounter of 07/07/18  Urinalysis, Routine w reflex microscopic  Result Value Ref Range   Color, Urine YELLOW YELLOW   APPearance CLEAR CLEAR   Specific Gravity, Urine 1.020 1.005 - 1.030   pH 6.0 5.0 - 8.0   Glucose, UA NEGATIVE NEGATIVE mg/dL   Hgb urine dipstick SMALL (A) NEGATIVE   Bilirubin Urine NEGATIVE NEGATIVE   Ketones, ur NEGATIVE NEGATIVE mg/dL   Protein, ur NEGATIVE NEGATIVE mg/dL   Nitrite NEGATIVE NEGATIVE   Leukocytes, UA NEGATIVE NEGATIVE   RBC / HPF 0-5 0 - 5 RBC/hpf   WBC, UA 0-5 0 - 5 WBC/hpf   Bacteria, UA NONE SEEN NONE SEEN   Squamous Epithelial / LPF 0-5 0 - 5   Mucus PRESENT    Hyaline Casts, UA PRESENT   Basic metabolic panel  Result Value Ref Range   Sodium 141 135 - 145 mmol/L   Potassium 3.8 3.5 - 5.1 mmol/L   Chloride 108 98 - 111 mmol/L   CO2 27 22 - 32 mmol/L   Glucose, Bld 122 (H) 70 - 99 mg/dL   BUN  15 8 - 23 mg/dL   Creatinine, Ser 0.79 0.44 - 1.00 mg/dL   Calcium 9.2 8.9 - 10.3 mg/dL   GFR calc non Af Amer >60 >60 mL/min   GFR calc Af Amer >60 >60 mL/min   Anion gap 6 5 - 15  CBC with Differential/Platelet  Result Value Ref Range   WBC 6.9 4.0 - 10.5 K/uL   RBC 4.92 3.87 - 5.11 MIL/uL   Hemoglobin 13.7 12.0 - 15.0 g/dL   HCT 44.1 36.0 - 46.0 %   MCV 89.6 80.0 - 100.0 fL   MCH 27.8 26.0 - 34.0 pg   MCHC 31.1 30.0 - 36.0 g/dL   RDW 13.0 11.5 - 15.5 %   Platelets 244 150 - 400 K/uL   nRBC 0.0 0.0 - 0.2 %   Neutrophils Relative % 55 %   Neutro Abs 3.9 1.7 - 7.7 K/uL   Lymphocytes Relative 35 %   Lymphs Abs 2.4 0.7 - 4.0 K/uL   Monocytes Relative 6 %   Monocytes Absolute 0.4 0.1 - 1.0 K/uL   Eosinophils Relative 3 %   Eosinophils Absolute 0.2 0.0 - 0.5 K/uL   Basophils Relative 1 %   Basophils Absolute 0.0 0.0 - 0.1 K/uL   Immature Granulocytes 0 %   Abs Immature Granulocytes 0.02 0.00 - 0.07 K/uL    Ct Renal Stone Study Result Date: 07/08/2018 CLINICAL DATA:  LEFT back pain, LEFT side lower back pain since yesterday, similar episode a few weeks ago EXAM: CT ABDOMEN AND PELVIS WITHOUT CONTRAST TECHNIQUE: Multidetector CT imaging of the abdomen and pelvis was performed following the standard protocol without IV contrast. Sagittal and coronal MPR images reconstructed from axial data set. No oral contrast was administered. COMPARISON:  04/27/2017 FINDINGS: Lower chest: Lung bases clear Hepatobiliary: Gallbladder and liver unremarkable Pancreas: Normal appearance Spleen: Normal appearance Adrenals/Urinary Tract: Adrenal glands normal appearance. Kidneys, ureters, and bladder normal appearance. No ureteral calcification or dilatation. Stomach/Bowel: Normal appendix. Sigmoid diverticulosis without evidence of diverticulitis. Stomach and bowel loops otherwise normal appearance. Vascular/Lymphatic: Atherosclerotic calcifications aorta and iliac arteries. Multiple pelvic phleboliths. No  adenopathy. Reproductive: Uterus surgically absent. Normal sized ovaries without mass. Other: No  free air or free fluid. No hernia or acute inflammatory process. Musculoskeletal: Osseous demineralization with scattered degenerative disc and facet disease changes of the lumbar spine. Grade 1 anterolisthesis L4-L5 due to degenerative facet disease. Pseudo disc at L4-L5. No definite disc herniation or neural compression. IMPRESSION: No acute intra-abdominal or intrapelvic abnormalities. Degenerative disc and facet disease changes of lower lumbar spine as above. Electronically Signed   By: Lavonia Dana M.D.   On: 07/08/2018 18:20    1845:  Workup reassuring. Tx symptomatically at this time. Dx and testing d/w pt and family.  Questions answered.  Verb understanding, agreeable to d/c home with outpt f/u.    Final Clinical Impressions(s) / ED Diagnoses   Final diagnoses:  None    ED Discharge Orders    None       Francine Graven, DO 07/11/18 1648

## 2018-07-08 NOTE — ED Triage Notes (Signed)
Pt c/o lower LT back pain. Pt states she was here last night. Pt states she was told by EDP to come back for CT scan if she felt any worse.

## 2019-01-27 DIAGNOSIS — R300 Dysuria: Secondary | ICD-10-CM | POA: Diagnosis not present

## 2019-01-27 DIAGNOSIS — Z6827 Body mass index (BMI) 27.0-27.9, adult: Secondary | ICD-10-CM | POA: Diagnosis not present

## 2019-05-05 DIAGNOSIS — K219 Gastro-esophageal reflux disease without esophagitis: Secondary | ICD-10-CM | POA: Diagnosis not present

## 2019-05-05 DIAGNOSIS — E782 Mixed hyperlipidemia: Secondary | ICD-10-CM | POA: Diagnosis not present

## 2019-05-05 DIAGNOSIS — R001 Bradycardia, unspecified: Secondary | ICD-10-CM | POA: Diagnosis not present

## 2019-05-05 DIAGNOSIS — E559 Vitamin D deficiency, unspecified: Secondary | ICD-10-CM | POA: Diagnosis not present

## 2019-05-05 DIAGNOSIS — R5383 Other fatigue: Secondary | ICD-10-CM | POA: Diagnosis not present

## 2019-05-05 DIAGNOSIS — Z6827 Body mass index (BMI) 27.0-27.9, adult: Secondary | ICD-10-CM | POA: Diagnosis not present

## 2019-05-05 DIAGNOSIS — E039 Hypothyroidism, unspecified: Secondary | ICD-10-CM | POA: Diagnosis not present

## 2019-05-08 DIAGNOSIS — Z20828 Contact with and (suspected) exposure to other viral communicable diseases: Secondary | ICD-10-CM | POA: Diagnosis not present

## 2019-05-19 DIAGNOSIS — J159 Unspecified bacterial pneumonia: Secondary | ICD-10-CM | POA: Diagnosis not present

## 2019-05-19 DIAGNOSIS — R5383 Other fatigue: Secondary | ICD-10-CM | POA: Diagnosis not present

## 2019-06-03 DIAGNOSIS — Z6826 Body mass index (BMI) 26.0-26.9, adult: Secondary | ICD-10-CM | POA: Diagnosis not present

## 2019-06-03 DIAGNOSIS — E782 Mixed hyperlipidemia: Secondary | ICD-10-CM | POA: Diagnosis not present

## 2019-06-03 DIAGNOSIS — E039 Hypothyroidism, unspecified: Secondary | ICD-10-CM | POA: Diagnosis not present

## 2019-06-03 DIAGNOSIS — R5383 Other fatigue: Secondary | ICD-10-CM | POA: Diagnosis not present

## 2019-06-03 DIAGNOSIS — Z23 Encounter for immunization: Secondary | ICD-10-CM | POA: Diagnosis not present

## 2019-06-03 DIAGNOSIS — J159 Unspecified bacterial pneumonia: Secondary | ICD-10-CM | POA: Diagnosis not present

## 2019-06-03 DIAGNOSIS — K219 Gastro-esophageal reflux disease without esophagitis: Secondary | ICD-10-CM | POA: Diagnosis not present

## 2019-08-13 DIAGNOSIS — J209 Acute bronchitis, unspecified: Secondary | ICD-10-CM | POA: Diagnosis not present

## 2019-08-13 DIAGNOSIS — R05 Cough: Secondary | ICD-10-CM | POA: Diagnosis not present

## 2020-03-25 IMAGING — DX DG LUMBAR SPINE COMPLETE 4+V
5 series · 5 of 5 positions shown · non-contrast
Comparison: 04/27/2017

CLINICAL DATA: Low back pain

EXAM:
LUMBAR SPINE - COMPLETE 4+ VIEW

[l-spine ap]
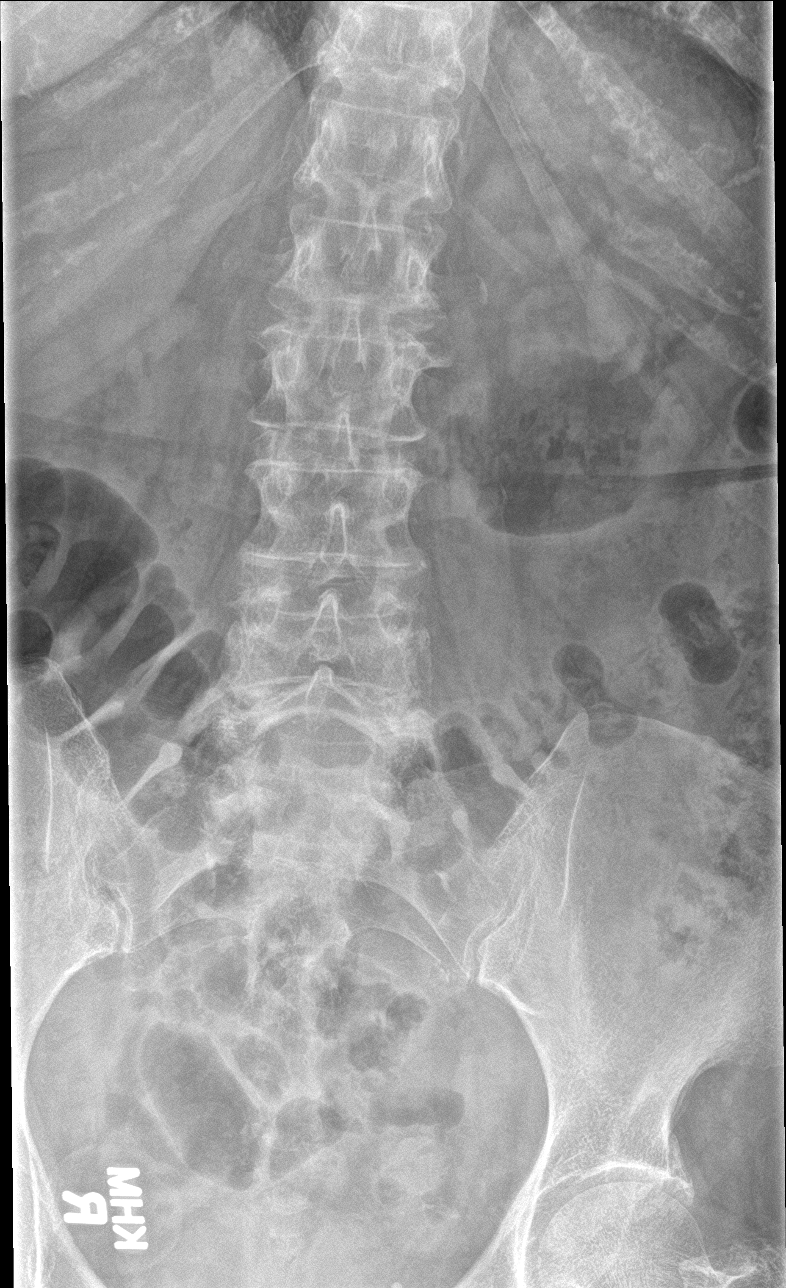

[l-spine obl (1 of 2)]
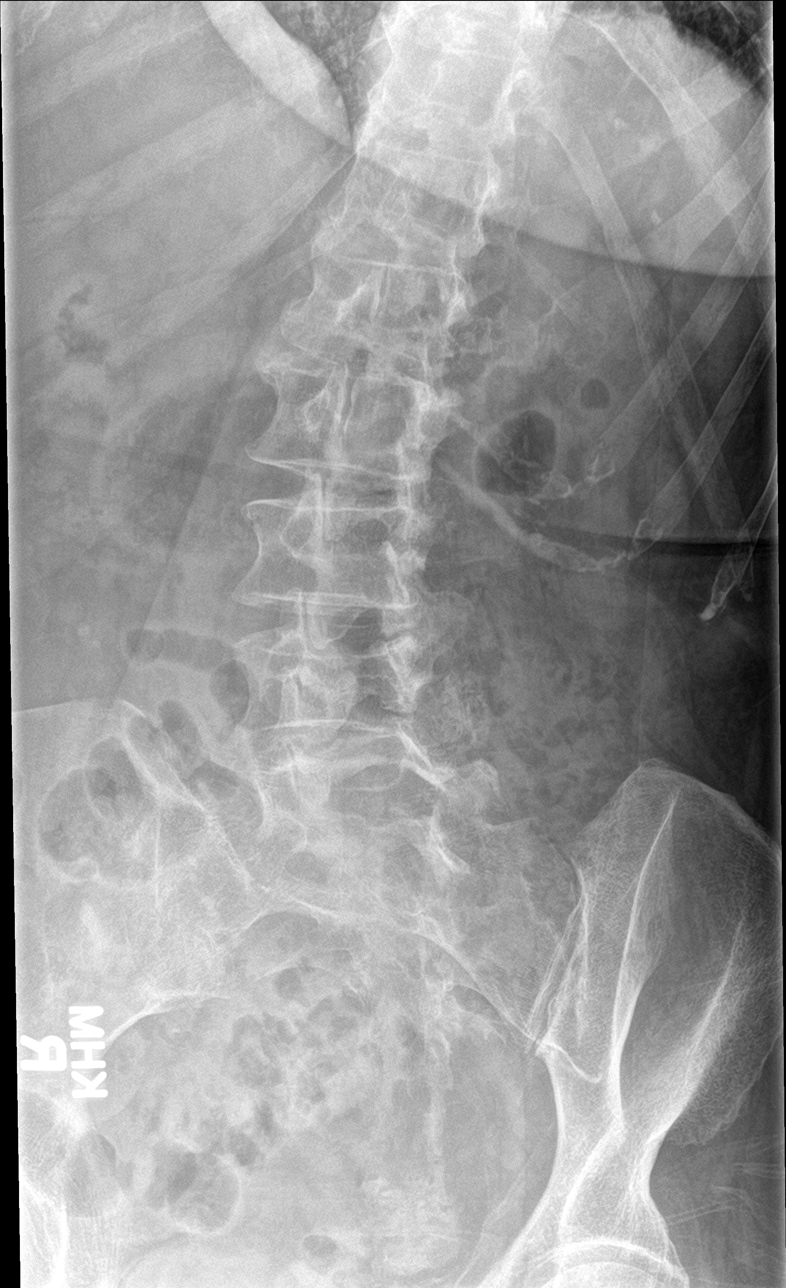

[l-spine obl (2 of 2)]
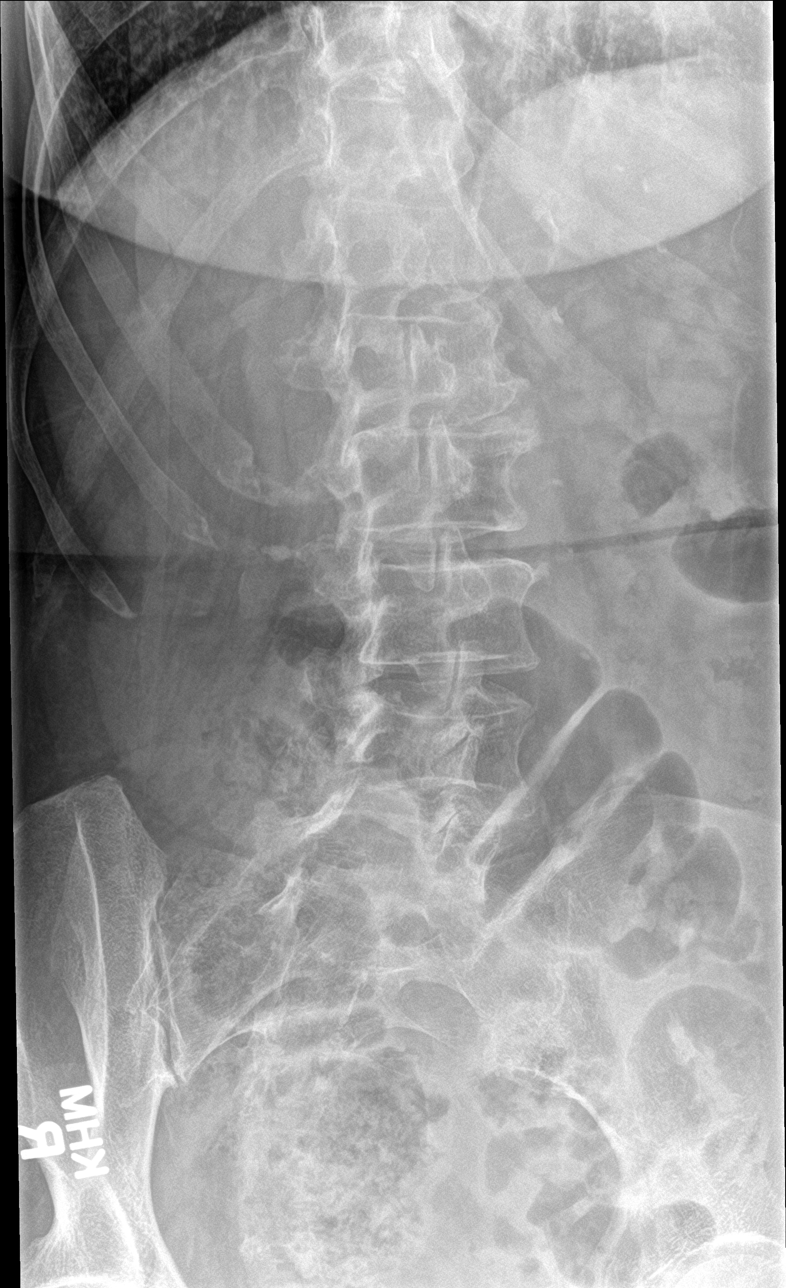

[l-spine lat]
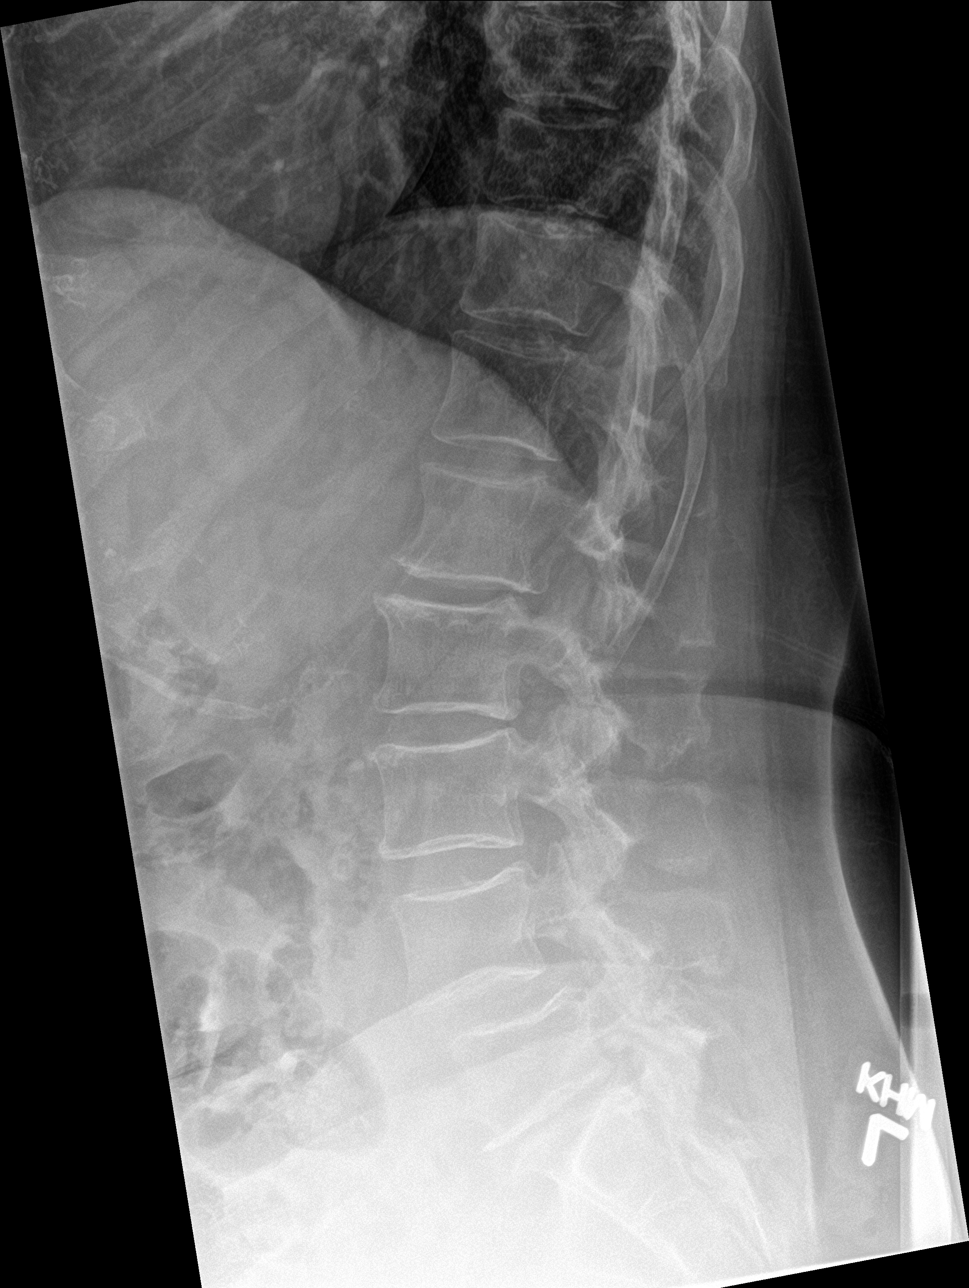

[l-spine spot]
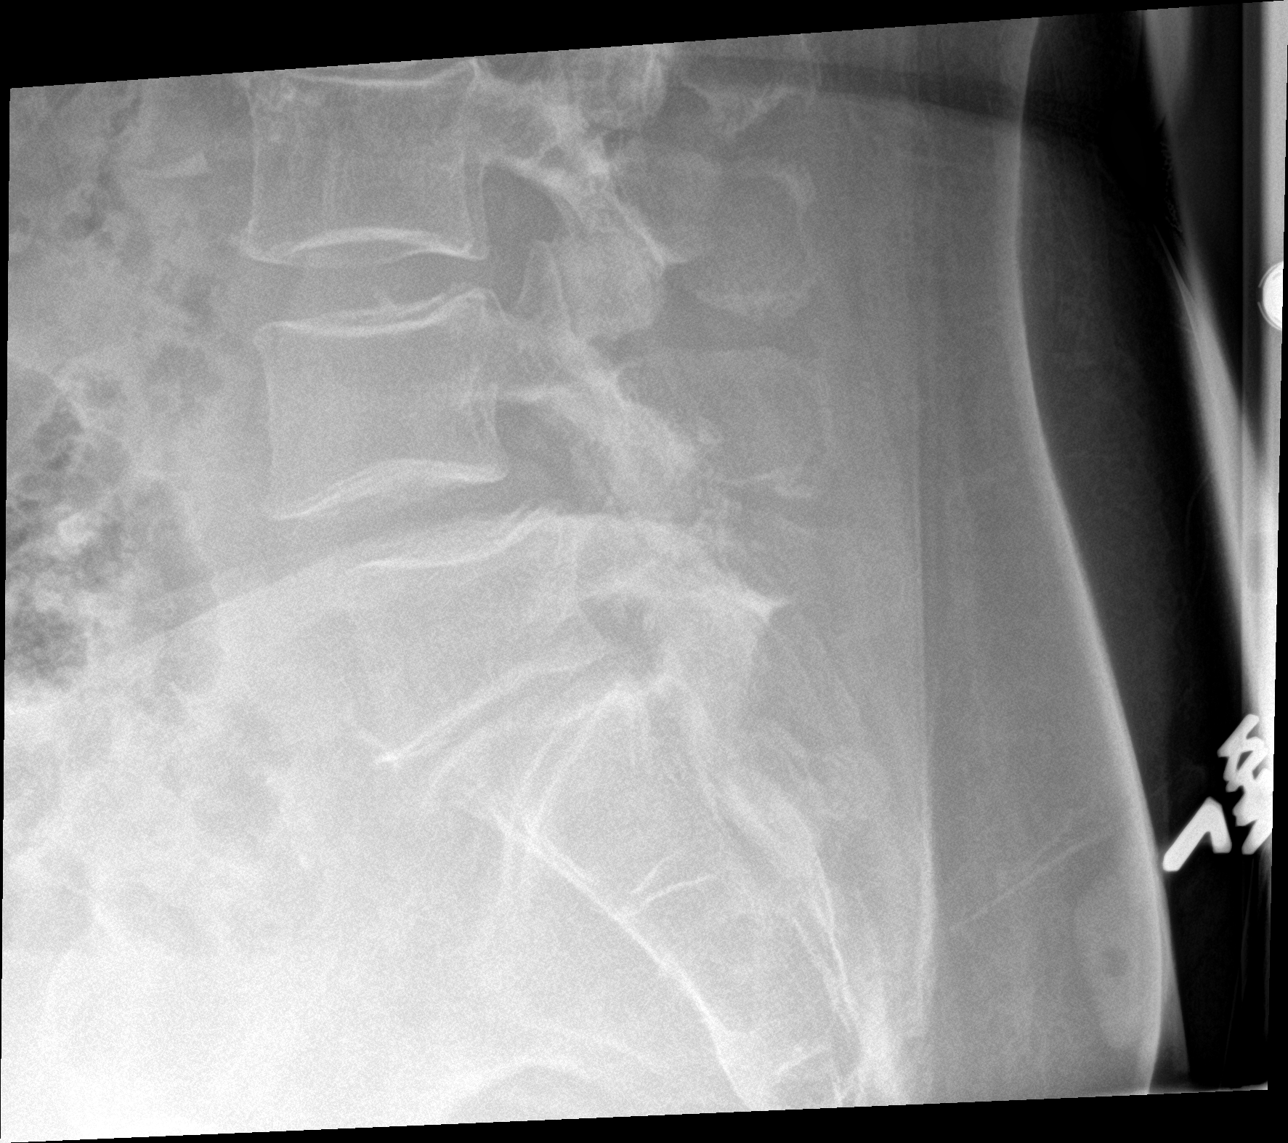

[5 of 5 positions shown; findings below may reference images not displayed]

FINDINGS: There is stable mild anterolisthesis L4 upon L5. Severe narrowing of
the L5-S1 disc is stable. There is no vertebral compression
deformity. Osteopenia. No definite acute fracture.
IMPRESSION: No acute bony pathology.  Chronic changes.

## 2020-04-13 DIAGNOSIS — R5383 Other fatigue: Secondary | ICD-10-CM | POA: Diagnosis not present

## 2020-04-13 DIAGNOSIS — E559 Vitamin D deficiency, unspecified: Secondary | ICD-10-CM | POA: Diagnosis not present

## 2020-04-13 DIAGNOSIS — R413 Other amnesia: Secondary | ICD-10-CM | POA: Diagnosis not present

## 2020-04-13 DIAGNOSIS — M4316 Spondylolisthesis, lumbar region: Secondary | ICD-10-CM | POA: Diagnosis not present

## 2020-04-13 DIAGNOSIS — E039 Hypothyroidism, unspecified: Secondary | ICD-10-CM | POA: Diagnosis not present

## 2020-04-13 DIAGNOSIS — Z6827 Body mass index (BMI) 27.0-27.9, adult: Secondary | ICD-10-CM | POA: Diagnosis not present

## 2020-05-13 DIAGNOSIS — Z6827 Body mass index (BMI) 27.0-27.9, adult: Secondary | ICD-10-CM | POA: Diagnosis not present

## 2020-05-13 DIAGNOSIS — R5383 Other fatigue: Secondary | ICD-10-CM | POA: Diagnosis not present

## 2020-05-13 DIAGNOSIS — E538 Deficiency of other specified B group vitamins: Secondary | ICD-10-CM | POA: Diagnosis not present

## 2020-05-13 DIAGNOSIS — E559 Vitamin D deficiency, unspecified: Secondary | ICD-10-CM | POA: Diagnosis not present

## 2020-05-13 DIAGNOSIS — R69 Illness, unspecified: Secondary | ICD-10-CM | POA: Diagnosis not present

## 2020-05-13 DIAGNOSIS — R413 Other amnesia: Secondary | ICD-10-CM | POA: Diagnosis not present

## 2020-05-26 DIAGNOSIS — G9389 Other specified disorders of brain: Secondary | ICD-10-CM | POA: Diagnosis not present

## 2020-05-26 DIAGNOSIS — I6782 Cerebral ischemia: Secondary | ICD-10-CM | POA: Diagnosis not present

## 2020-05-26 DIAGNOSIS — R413 Other amnesia: Secondary | ICD-10-CM | POA: Diagnosis not present

## 2020-05-26 DIAGNOSIS — G319 Degenerative disease of nervous system, unspecified: Secondary | ICD-10-CM | POA: Diagnosis not present

## 2020-08-10 DIAGNOSIS — E782 Mixed hyperlipidemia: Secondary | ICD-10-CM | POA: Diagnosis not present

## 2020-08-10 DIAGNOSIS — R413 Other amnesia: Secondary | ICD-10-CM | POA: Diagnosis not present

## 2020-08-10 DIAGNOSIS — R5383 Other fatigue: Secondary | ICD-10-CM | POA: Diagnosis not present

## 2020-08-10 DIAGNOSIS — R69 Illness, unspecified: Secondary | ICD-10-CM | POA: Diagnosis not present

## 2020-08-10 DIAGNOSIS — Z0001 Encounter for general adult medical examination with abnormal findings: Secondary | ICD-10-CM | POA: Diagnosis not present

## 2020-08-10 DIAGNOSIS — Z6828 Body mass index (BMI) 28.0-28.9, adult: Secondary | ICD-10-CM | POA: Diagnosis not present

## 2020-08-10 DIAGNOSIS — E039 Hypothyroidism, unspecified: Secondary | ICD-10-CM | POA: Diagnosis not present

## 2020-10-28 DIAGNOSIS — E039 Hypothyroidism, unspecified: Secondary | ICD-10-CM | POA: Diagnosis not present

## 2020-10-28 DIAGNOSIS — K219 Gastro-esophageal reflux disease without esophagitis: Secondary | ICD-10-CM | POA: Diagnosis not present

## 2020-10-28 DIAGNOSIS — E559 Vitamin D deficiency, unspecified: Secondary | ICD-10-CM | POA: Diagnosis not present

## 2020-10-28 DIAGNOSIS — E782 Mixed hyperlipidemia: Secondary | ICD-10-CM | POA: Diagnosis not present

## 2020-10-28 DIAGNOSIS — I10 Essential (primary) hypertension: Secondary | ICD-10-CM | POA: Diagnosis not present

## 2020-10-28 DIAGNOSIS — E538 Deficiency of other specified B group vitamins: Secondary | ICD-10-CM | POA: Diagnosis not present

## 2020-10-28 DIAGNOSIS — E7849 Other hyperlipidemia: Secondary | ICD-10-CM | POA: Diagnosis not present

## 2020-11-11 DIAGNOSIS — R69 Illness, unspecified: Secondary | ICD-10-CM | POA: Diagnosis not present

## 2020-11-11 DIAGNOSIS — Z6829 Body mass index (BMI) 29.0-29.9, adult: Secondary | ICD-10-CM | POA: Diagnosis not present

## 2020-11-11 DIAGNOSIS — R5383 Other fatigue: Secondary | ICD-10-CM | POA: Diagnosis not present

## 2020-11-11 DIAGNOSIS — E039 Hypothyroidism, unspecified: Secondary | ICD-10-CM | POA: Diagnosis not present

## 2020-11-11 DIAGNOSIS — Z1331 Encounter for screening for depression: Secondary | ICD-10-CM | POA: Diagnosis not present

## 2020-11-11 DIAGNOSIS — E7849 Other hyperlipidemia: Secondary | ICD-10-CM | POA: Diagnosis not present

## 2020-11-11 DIAGNOSIS — Z1389 Encounter for screening for other disorder: Secondary | ICD-10-CM | POA: Diagnosis not present

## 2020-11-11 DIAGNOSIS — E559 Vitamin D deficiency, unspecified: Secondary | ICD-10-CM | POA: Diagnosis not present

## 2020-12-09 DIAGNOSIS — Z6829 Body mass index (BMI) 29.0-29.9, adult: Secondary | ICD-10-CM | POA: Diagnosis not present

## 2020-12-09 DIAGNOSIS — M545 Low back pain, unspecified: Secondary | ICD-10-CM | POA: Diagnosis not present

## 2021-02-08 DIAGNOSIS — E559 Vitamin D deficiency, unspecified: Secondary | ICD-10-CM | POA: Diagnosis not present

## 2021-02-08 DIAGNOSIS — E039 Hypothyroidism, unspecified: Secondary | ICD-10-CM | POA: Diagnosis not present

## 2021-02-08 DIAGNOSIS — R319 Hematuria, unspecified: Secondary | ICD-10-CM | POA: Diagnosis not present

## 2021-02-08 DIAGNOSIS — R5383 Other fatigue: Secondary | ICD-10-CM | POA: Diagnosis not present

## 2021-02-08 DIAGNOSIS — Z6828 Body mass index (BMI) 28.0-28.9, adult: Secondary | ICD-10-CM | POA: Diagnosis not present

## 2021-02-16 DIAGNOSIS — G4700 Insomnia, unspecified: Secondary | ICD-10-CM | POA: Diagnosis not present

## 2021-02-16 DIAGNOSIS — Z880 Allergy status to penicillin: Secondary | ICD-10-CM | POA: Diagnosis not present

## 2021-02-16 DIAGNOSIS — Z8249 Family history of ischemic heart disease and other diseases of the circulatory system: Secondary | ICD-10-CM | POA: Diagnosis not present

## 2021-02-16 DIAGNOSIS — K219 Gastro-esophageal reflux disease without esophagitis: Secondary | ICD-10-CM | POA: Diagnosis not present

## 2021-02-16 DIAGNOSIS — Z881 Allergy status to other antibiotic agents status: Secondary | ICD-10-CM | POA: Diagnosis not present

## 2021-02-16 DIAGNOSIS — R030 Elevated blood-pressure reading, without diagnosis of hypertension: Secondary | ICD-10-CM | POA: Diagnosis not present

## 2021-02-16 DIAGNOSIS — E039 Hypothyroidism, unspecified: Secondary | ICD-10-CM | POA: Diagnosis not present

## 2021-02-22 DIAGNOSIS — Z6828 Body mass index (BMI) 28.0-28.9, adult: Secondary | ICD-10-CM | POA: Diagnosis not present

## 2021-02-22 DIAGNOSIS — R5383 Other fatigue: Secondary | ICD-10-CM | POA: Diagnosis not present

## 2021-02-22 DIAGNOSIS — E7849 Other hyperlipidemia: Secondary | ICD-10-CM | POA: Diagnosis not present

## 2021-02-22 DIAGNOSIS — E559 Vitamin D deficiency, unspecified: Secondary | ICD-10-CM | POA: Diagnosis not present

## 2021-02-22 DIAGNOSIS — R69 Illness, unspecified: Secondary | ICD-10-CM | POA: Diagnosis not present

## 2021-02-22 DIAGNOSIS — E039 Hypothyroidism, unspecified: Secondary | ICD-10-CM | POA: Diagnosis not present

## 2021-04-11 DIAGNOSIS — E039 Hypothyroidism, unspecified: Secondary | ICD-10-CM | POA: Diagnosis not present

## 2021-08-11 ENCOUNTER — Emergency Department (HOSPITAL_COMMUNITY)
Admission: EM | Admit: 2021-08-11 | Discharge: 2021-08-11 | Disposition: A | Discharge status: Home / Self Care | Payer: Medicare HMO | Attending: Emergency Medicine | Admitting: Emergency Medicine

## 2021-08-11 ENCOUNTER — Encounter (HOSPITAL_COMMUNITY): Payer: Self-pay

## 2021-08-11 ENCOUNTER — Other Ambulatory Visit: Payer: Self-pay

## 2021-08-11 ENCOUNTER — Emergency Department (HOSPITAL_COMMUNITY): Payer: Medicare HMO

## 2021-08-11 DIAGNOSIS — K449 Diaphragmatic hernia without obstruction or gangrene: Secondary | ICD-10-CM | POA: Diagnosis not present

## 2021-08-11 DIAGNOSIS — R109 Unspecified abdominal pain: Secondary | ICD-10-CM | POA: Diagnosis not present

## 2021-08-11 DIAGNOSIS — K8020 Calculus of gallbladder without cholecystitis without obstruction: Secondary | ICD-10-CM | POA: Diagnosis not present

## 2021-08-11 DIAGNOSIS — K5730 Diverticulosis of large intestine without perforation or abscess without bleeding: Secondary | ICD-10-CM | POA: Diagnosis not present

## 2021-08-11 DIAGNOSIS — M545 Low back pain, unspecified: Secondary | ICD-10-CM | POA: Diagnosis not present

## 2021-08-11 DIAGNOSIS — M47816 Spondylosis without myelopathy or radiculopathy, lumbar region: Secondary | ICD-10-CM | POA: Diagnosis not present

## 2021-08-11 DIAGNOSIS — Z79899 Other long term (current) drug therapy: Secondary | ICD-10-CM | POA: Insufficient documentation

## 2021-08-11 LAB — URINALYSIS, ROUTINE W REFLEX MICROSCOPIC
Bilirubin Urine: NEGATIVE
Glucose, UA: NEGATIVE mg/dL
Ketones, ur: NEGATIVE mg/dL
Leukocytes,Ua: NEGATIVE
Nitrite: NEGATIVE
Protein, ur: NEGATIVE mg/dL
Specific Gravity, Urine: >1.03 — ABNORMAL HIGH (ref 1.005–1.030)
pH: 6 (ref 5.0–8.0)

## 2021-08-11 LAB — URINALYSIS, MICROSCOPIC (REFLEX): Bacteria, UA: NONE SEEN

## 2021-08-11 LAB — BASIC METABOLIC PANEL
Anion gap: 6 (ref 5–15)
BUN: 14 mg/dL (ref 8–23)
CO2: 27 mmol/L (ref 22–32)
Calcium: 9.1 mg/dL (ref 8.9–10.3)
Chloride: 105 mmol/L (ref 98–111)
Creatinine, Ser: 0.79 mg/dL (ref 0.44–1.00)
GFR, Estimated: >60 mL/min (ref >60)
Glucose, Bld: 101 mg/dL — ABNORMAL HIGH (ref 70–99)
Potassium: 4.1 mmol/L (ref 3.5–5.1)
Sodium: 138 mmol/L (ref 135–145)

## 2021-08-11 LAB — CBC
HCT: 42.3 % (ref 36.0–46.0)
Hemoglobin: 13.6 g/dL (ref 12.0–15.0)
MCH: 29.1 pg (ref 26.0–34.0)
MCHC: 32.2 g/dL (ref 30.0–36.0)
MCV: 90.4 fL (ref 80.0–100.0)
Platelets: 252 10*3/uL (ref 150–400)
RBC: 4.68 MIL/uL (ref 3.87–5.11)
RDW: 12.9 % (ref 11.5–15.5)
WBC: 5.8 10*3/uL (ref 4.0–10.5)
nRBC: 0 % (ref 0.0–0.2)

## 2021-08-11 MED ORDER — OXYCODONE-ACETAMINOPHEN 5-325 MG PO TABS
1.0000 | ORAL_TABLET | Freq: Once | ORAL | Status: AC
  Administered 2021-08-11: 13:00:00 1 via ORAL
  Filled 2021-08-11: qty 1

## 2021-08-11 MED ORDER — ONDANSETRON 4 MG PO TBDP
4.0000 mg | ORAL_TABLET | Freq: Once | ORAL | Status: AC
  Administered 2021-08-11: 13:00:00 4 mg via ORAL
  Filled 2021-08-11: qty 1

## 2021-08-11 MED ORDER — VALACYCLOVIR HCL 1 G PO TABS: 1000.0000 mg | ORAL_TABLET | Freq: Three times a day (TID) | ORAL | 0 refills | Status: DC

## 2021-08-11 MED ORDER — OXYCODONE-ACETAMINOPHEN 5-325 MG PO TABS: 1.0000 | ORAL_TABLET | ORAL | 0 refills | Status: DC | PRN

## 2021-08-11 MED ORDER — FENTANYL CITRATE PF 50 MCG/ML IJ SOSY
75.0000 ug | PREFILLED_SYRINGE | Freq: Once | INTRAMUSCULAR | Status: AC
  Administered 2021-08-11: 14:00:00 75 ug via INTRAMUSCULAR
  Filled 2021-08-11: qty 2

## 2021-08-11 NOTE — ED Provider Notes (Signed)
[facility] Provider Note   CSN: [id number] Arrival date & time: 08/11/21  1118     History Chief Complaint  Patient presents with   Flank Pain    [name] [name] is a 75 y.o. female with a history of thyroid disease, GERD and anxiety presenting with a 24-hour history of left back and flank discomfort.  She describes a "funny" sensation across to her left mid to lower back which started yesterday.  Over the course of the night it has become more painful, described as sharp and radiating into her left lateral flank region and is worsened with movement and palpation of her skin.  She denies fevers or chills, dysuria or hematuria.  She does not have a history of kidney stones.  She also denies a history of shingles although her husband has had this problem in the past and she is concerned that this is possibly a shingles outbreak.  She has had no rash or redness of her skin in the distribution of her pain.  She states initially she had some itching sensation but noticed simply painful.  She has taken Tylenol without any improvement in her pain symptoms.  Additionally she denies nausea or vomiting, diarrhea, abdominal pain, chest pain or shortness of breath.  The history is provided by the patient.      Past Medical History:  Diagnosis Date   Anxiety    GERD (gastroesophageal reflux disease)    Thyroid disease     Patient Active Problem List   Diagnosis Date Noted   Fecal smearing 04/24/2018   Rectal bleeding 04/24/2018   Tubular adenoma 04/24/2018   Syncope 06/04/2015   TIA (transient ischemic attack) 06/04/2015   Faintness    NECK PAIN 06/24/2008   SHOULDER PAIN, LEFT 05/04/2008   IMPINGEMENT SYNDROME 05/04/2008    Past Surgical History:  Procedure Laterality Date   ABDOMINAL HYSTERECTOMY     BLADDER REPAIR     COLONOSCOPY N/A 04/29/2018   Procedure: COLONOSCOPY;  Surgeon: Rogene Houston, MD;  [facility];  Service: Endoscopy;  Laterality:  N/A;  2:35   DILATION AND CURETTAGE OF UTERUS     times 4   I & D EXTREMITY  06/28/2012   Procedure: MINOR IRRIGATION AND DEBRIDEMENT EXTREMITY;  Surgeon: Cammie Sickle., MD;  Location: [facility];  Service: Orthopedics;  Laterality: Right;  Debride Mucoid Cyst, Debride Joint    POLYPECTOMY  04/29/2018   Procedure: POLYPECTOMY INTESTINAL;  Surgeon: Rogene Houston, MD;  [facility];  Service: Endoscopy;;     OB History     Gravida  8   Para  2   Term      Preterm      AB  6   Living         SAB  6   IAB      Ectopic      Multiple      Live Births              Family History  Problem Relation Age of Onset   Cancer Father     Social History   Tobacco Use   Smoking status: Never   Smokeless tobacco: Never  Substance Use Topics   Alcohol use: No    Alcohol/week: 0.0 standard drinks   Drug use: No    Home Medications Prior to Admission medications   Medication Sig Start Date End Date Taking? Authorizing Provider  oxyCODONE-acetaminophen (PERCOCET/ROXICET) 5-325 MG tablet Take 1 tablet by mouth every 4 (four) hours as needed. 08/11/21  Yes Doreather Hoxworth, Almyra Free, PA-C  valACYclovir (VALTREX) 1000 MG tablet Take 1 tablet (1,000 mg total) by mouth 3 (three) times daily. 08/11/21  Yes Wilmer Berryhill, Almyra Free, PA-C  acetaminophen (TYLENOL 8 HOUR) 650 MG CR tablet Take 1 tablet (650 mg total) by mouth every 8 (eight) hours. 07/08/18   Varney Biles, MD  ibuprofen (ADVIL,MOTRIN) 200 MG tablet Take 200 mg by mouth every 6 (six) hours as needed (pain).     [provider]  levothyroxine (SYNTHROID, LEVOTHROID) 88 MCG tablet Take 88 mcg by mouth daily before breakfast.    [provider]  Lidocaine 4 % PTCH Apply 1 patch topically 2 (two) times daily. 07/08/18   Varney Biles, MD  methocarbamol (ROBAXIN) 500 MG tablet Take 1 tablet (500 mg total) by mouth every 6 (six) hours as needed for muscle spasms. 07/08/18   Francine Graven, DO   omeprazole (PRILOSEC) 20 MG capsule Take 1 capsule (20 mg total) by mouth daily. 06/05/15   Barton Dubois, MD    Allergies    Prednisone, Amoxicillin-pot clavulanate, and Ciprofloxacin  Review of Systems   Review of Systems  Constitutional:  Negative for chills and fever.  HENT:  Negative for congestion and sore throat.   Eyes: Negative.   Respiratory:  Negative for chest tightness and shortness of breath.   Cardiovascular:  Negative for chest pain.  Gastrointestinal:  Negative for abdominal pain, nausea and vomiting.  Musculoskeletal:  Positive for back pain. Negative for arthralgias, joint swelling and neck pain.  Skin: Negative.  Negative for color change, rash and wound.  Neurological:  Negative for dizziness, weakness, light-headedness, numbness and headaches.  Psychiatric/Behavioral: Negative.     Physical Exam Updated Vital Signs BP (!) 149/74 (BP Location: Right Arm)    Pulse (!) 52    Temp 97.7 F (36.5 C)    Resp 16    Ht 5\' 6"  (1.676 m)    Wt 78.5 kg    SpO2 98%    BMI 27.92 kg/m   Physical Exam Vitals and nursing note reviewed.  Constitutional:      Appearance: She is well-developed.  HENT:     Head: Normocephalic and atraumatic.  Eyes:     Conjunctiva/sclera: Conjunctivae normal.  Cardiovascular:     Rate and Rhythm: Normal rate and regular rhythm.     Heart sounds: Normal heart sounds.  Pulmonary:     Effort: Pulmonary effort is normal.     Breath sounds: Normal breath sounds. No wheezing.  Abdominal:     General: Bowel sounds are normal. There is no distension.     Palpations: Abdomen is soft.     Tenderness: There is no abdominal tenderness. There is no guarding.  Musculoskeletal:        General: Normal range of motion.     Cervical back: Normal range of motion.       Back:     Comments: Patient is tender to even mild touch of her skin in her left lower back radiating across to her mid axillary line.  There is no erythema or rash noted.  There is no  midline tenderness to palpation along her thoracic or lumbar spine.  Skin:    General: Skin is warm and dry.     Findings: No erythema or rash.  Neurological:     General: No focal deficit present.     Mental Status:  She is alert.  Psychiatric:        Mood and Affect: Mood normal.    ED Results / Procedures / Treatments   Labs (all labs ordered are listed, but only abnormal results are displayed) Labs Reviewed  URINALYSIS, ROUTINE W REFLEX MICROSCOPIC - Abnormal; Notable for the following components:      Result Value   APPearance HAZY (*)    Specific Gravity, Urine >1.030 (*)    Hgb urine dipstick SMALL (*)    All other components within normal limits  BASIC METABOLIC PANEL - Abnormal; Notable for the following components:   Glucose, Bld 101 (*)    All other components within normal limits  CBC  URINALYSIS, MICROSCOPIC (REFLEX)    EKG None  Radiology CT Renal Stone Study  Result Date: 08/11/2021 CLINICAL DATA:  Flank pain, kidney stone suspected; technologist note states left flank pain EXAM: CT ABDOMEN AND PELVIS WITHOUT CONTRAST TECHNIQUE: Multidetector CT imaging of the abdomen and pelvis was performed following the standard protocol without IV contrast. COMPARISON:  07/08/2018 FINDINGS: Lower chest: Hiatal hernia. Hepatobiliary: No focal liver abnormality. Cholelithiasis. No biliary dilatation. Pancreas: Unremarkable. Spleen: Unremarkable. Adrenals/Urinary Tract: Adrenals, kidneys, and bladder are unremarkable. Stomach/Bowel: Stomach is within normal limits. Bowel is normal in caliber. Normal appendix. Sigmoid diverticulosis. Vascular/Lymphatic: Atherosclerosis.  No enlarged nodes. Reproductive: Unremarkable. Other: No free fluid.  Abdominal wall is unremarkable. Musculoskeletal: Degenerative changes of the lumbar spine. IMPRESSION: No acute abnormality.  No urinary tract calculi. Cholelithiasis. Diverticulosis. Aortic Atherosclerosis (ICD10-I70.0). Electronically Signed   By:  Macy Mis M.D.   On: 08/11/2021 14:17    Procedures Procedures   Medications Ordered in ED Medications  oxyCODONE-acetaminophen (PERCOCET/ROXICET) 5-325 MG per tablet 1 tablet (1 tablet Oral Given 08/11/21 1249)  ondansetron (ZOFRAN-ODT) disintegrating tablet 4 mg (4 mg Oral Given 08/11/21 1249)  fentaNYL (SUBLIMAZE) injection 75 mcg (75 mcg Intramuscular Given 08/11/21 1416)    ED Course  I have reviewed the triage vital signs and the nursing notes.  Pertinent labs & imaging results that were available during my care of the patient were reviewed by me and considered in my medical decision making (see chart for details).    MDM Rules/Calculators/A&P                         Patient's labs and imaging were reviewed.  Initially a urinalysis was positive for small amount of hemoglobin, however her microscopic was negative for RBCs.  Given her symptoms we did order a CT renal to rule out kidney stone and this test was negative.  Given her symptoms are reproduced just by gentle touch of her skin I suspect that this may be an early shingles which has not yet resulted in redness or rash.  She was prescribed pain medication, also Valtrex was prescribed, discussed starting this medication if a rash develops.  We also discussed other home treatments including capsaicin  cream which may help with pain relief as well.  Caution regarding populations to avoid as she is contagious once the rash develops and until it is scabbed over.  Advise recheck by her PCP in 1 week for any persistent or worsening symptoms.    Final Clinical Impression(s) / ED Diagnoses Final diagnoses:  Flank pain    Rx / DC Orders ED Discharge Orders          Ordered    oxyCODONE-acetaminophen (PERCOCET/ROXICET) 5-325 MG tablet  Every 4 hours PRN  08/11/21 1544    valACYclovir (VALTREX) 1000 MG tablet  3 times daily        08/11/21 1544             Evalee Jefferson, Hershal Coria 08/11/21 1557    Truddie Hidden,  MD 08/12/21 [phone]

## 2021-08-11 NOTE — ED Triage Notes (Signed)
Complains of left flank pain that started yesterday.  Denies urinary symptoms

## 2021-08-11 NOTE — Discharge Instructions (Signed)
As discussed I suspect you do have an early shingles which has not declared itself yet with the resulting rash.  You have been prescribed medication to help you with your symptoms.  Do not drive within 4 hours of taking oxycodone as this will make you drowsy.  You may start the Valtrex as discussed to help hopefully resolve this pain quicker.  Plan to see your doctor for recheck if your symptoms are not improving over the next week.

## 2021-08-17 DIAGNOSIS — Z6828 Body mass index (BMI) 28.0-28.9, adult: Secondary | ICD-10-CM | POA: Diagnosis not present

## 2021-08-17 DIAGNOSIS — N390 Urinary tract infection, site not specified: Secondary | ICD-10-CM | POA: Diagnosis not present

## 2021-08-17 DIAGNOSIS — M549 Dorsalgia, unspecified: Secondary | ICD-10-CM | POA: Diagnosis not present

## 2021-08-29 DIAGNOSIS — I10 Essential (primary) hypertension: Secondary | ICD-10-CM | POA: Diagnosis not present

## 2021-08-29 DIAGNOSIS — E782 Mixed hyperlipidemia: Secondary | ICD-10-CM | POA: Diagnosis not present

## 2021-08-29 DIAGNOSIS — E7849 Other hyperlipidemia: Secondary | ICD-10-CM | POA: Diagnosis not present

## 2021-08-29 DIAGNOSIS — Z0001 Encounter for general adult medical examination with abnormal findings: Secondary | ICD-10-CM | POA: Diagnosis not present

## 2021-08-29 DIAGNOSIS — E039 Hypothyroidism, unspecified: Secondary | ICD-10-CM | POA: Diagnosis not present

## 2021-08-31 DIAGNOSIS — F0390 Unspecified dementia without behavioral disturbance: Secondary | ICD-10-CM | POA: Diagnosis not present

## 2021-08-31 DIAGNOSIS — F5104 Psychophysiologic insomnia: Secondary | ICD-10-CM | POA: Diagnosis not present

## 2021-08-31 DIAGNOSIS — Z0001 Encounter for general adult medical examination with abnormal findings: Secondary | ICD-10-CM | POA: Diagnosis not present

## 2021-08-31 DIAGNOSIS — R5383 Other fatigue: Secondary | ICD-10-CM | POA: Diagnosis not present

## 2021-08-31 DIAGNOSIS — Z9189 Other specified personal risk factors, not elsewhere classified: Secondary | ICD-10-CM | POA: Diagnosis not present

## 2021-08-31 DIAGNOSIS — Z23 Encounter for immunization: Secondary | ICD-10-CM | POA: Diagnosis not present

## 2021-08-31 DIAGNOSIS — R531 Weakness: Secondary | ICD-10-CM | POA: Diagnosis not present

## 2021-08-31 DIAGNOSIS — E7849 Other hyperlipidemia: Secondary | ICD-10-CM | POA: Diagnosis not present

## 2021-08-31 DIAGNOSIS — R69 Illness, unspecified: Secondary | ICD-10-CM | POA: Diagnosis not present

## 2021-09-26 DIAGNOSIS — R69 Illness, unspecified: Secondary | ICD-10-CM | POA: Diagnosis not present

## 2021-09-26 DIAGNOSIS — I10 Essential (primary) hypertension: Secondary | ICD-10-CM | POA: Diagnosis not present

## 2021-09-26 DIAGNOSIS — R531 Weakness: Secondary | ICD-10-CM | POA: Diagnosis not present

## 2021-09-26 DIAGNOSIS — Z6827 Body mass index (BMI) 27.0-27.9, adult: Secondary | ICD-10-CM | POA: Diagnosis not present

## 2021-09-26 DIAGNOSIS — R42 Dizziness and giddiness: Secondary | ICD-10-CM | POA: Diagnosis not present

## 2021-10-03 DIAGNOSIS — Z6827 Body mass index (BMI) 27.0-27.9, adult: Secondary | ICD-10-CM | POA: Diagnosis not present

## 2021-10-03 DIAGNOSIS — R69 Illness, unspecified: Secondary | ICD-10-CM | POA: Diagnosis not present

## 2021-10-03 DIAGNOSIS — I10 Essential (primary) hypertension: Secondary | ICD-10-CM | POA: Diagnosis not present

## 2021-10-03 DIAGNOSIS — F0390 Unspecified dementia without behavioral disturbance: Secondary | ICD-10-CM | POA: Diagnosis not present

## 2021-10-03 DIAGNOSIS — R42 Dizziness and giddiness: Secondary | ICD-10-CM | POA: Diagnosis not present

## 2021-10-05 ENCOUNTER — Ambulatory Visit (INDEPENDENT_AMBULATORY_CARE_PROVIDER_SITE_OTHER): Payer: Medicare HMO

## 2021-10-05 ENCOUNTER — Encounter: Payer: Self-pay | Admitting: Cardiovascular Disease

## 2021-10-05 ENCOUNTER — Ambulatory Visit: Payer: Medicare HMO | Admitting: Cardiovascular Disease

## 2021-10-05 ENCOUNTER — Other Ambulatory Visit: Payer: Self-pay

## 2021-10-05 VITALS — BP 111/56 | HR 59 | Ht 66.0 in | Wt 169.8 lb

## 2021-10-05 DIAGNOSIS — E039 Hypothyroidism, unspecified: Secondary | ICD-10-CM

## 2021-10-05 DIAGNOSIS — E7800 Pure hypercholesterolemia, unspecified: Secondary | ICD-10-CM

## 2021-10-05 DIAGNOSIS — R001 Bradycardia, unspecified: Secondary | ICD-10-CM

## 2021-10-05 NOTE — Progress Notes (Unsigned)
Enrolled patient for a 3 day Zio XT monitor to be me mailed to patients home

## 2021-10-05 NOTE — Progress Notes (Signed)
Cardiology Office Note:    Date:  10/05/2021   ID:  Jodye, Scali Mar 24, 1946, MRN [id number]  PCP:  Curlene Labrum, MD   [facility] Providers Cardiologist:  Sanda Klein, MD     Referring MD: Curlene Labrum, MD   Chief Complaint  Patient presents with   Irregular Heart Beat       [name] [name] is a 76 y.o. female who is being seen today for the evaluation of bradycardia at the request of Burdine, Virgina Evener, MD.   History of Present Illness:    [name] [name] is a 76 y.o. female with a hx of generally good health, nephrolithiasis, mild Alzheimer's disease, treated hypothyroidism, has had recent problems with presyncope and bradycardia, worsened while taking Aricept.  She has had problems with fatigue, weakness, changes in eyesight and hearing and drop in body temperature for quite a while, but symptoms clearly worsened while she was receiving Aricept.  She was seen in her PCPs office and had sinus bradycardia at 50 bpm.  Aricept was stopped.  She presents today with a heart rate of 59 beats per minutes and feels a little better.  She has not had full-blown syncope.  She denies exertional angina or dyspnea, orthopnea, PND, lower extremity edema, focal neurological complaints, palpitations, claudication.  During the period of time when she had more severe bradycardia she had intermittently elevated blood pressure, but she does not have hypertension.  Her blood pressure today is normal.  She does not have diabetes mellitus.  She is euthyroid based on TSH that was checked on January 17.  She has never smoked cigarettes.  She does have a family history of coronary disease in her brother father (had bypass surgery) and mother (also had bypass surgery).  Relatively recent labs show LDL cholesterol of 148, otherwise normal lipid profile.  Carotid duplex ultrasound in 2016 showed minor intimal thickening without obstruction and patent vertebral with antegrade flow bilaterally.  Previous  CT of the abdomen pelvis showed mild aortic atherosclerosis and aortoiliac branch vessel atherosclerosis.  Normal ABI study in 2011.  Echocardiogram for syncope/TIA in 2016 showed normal left ventricular systolic function with EF 60 to 65% and normal regional wall motion.  Left atrium was normal in size and there was no meaningful valvular problem.  Past Medical History:  Diagnosis Date   Anxiety    GERD (gastroesophageal reflux disease)    Thyroid disease     Past Surgical History:  Procedure Laterality Date   ABDOMINAL HYSTERECTOMY     BLADDER REPAIR     COLONOSCOPY N/A 04/29/2018   Procedure: COLONOSCOPY;  Surgeon: Rogene Houston, MD;  [facility];  Service: Endoscopy;  Laterality: N/A;  2:35   DILATION AND CURETTAGE OF UTERUS     times 4   I & D EXTREMITY  06/28/2012   Procedure: MINOR IRRIGATION AND DEBRIDEMENT EXTREMITY;  Surgeon: Cammie Sickle., MD;  Location: [facility];  Service: Orthopedics;  Laterality: Right;  Debride Mucoid Cyst, Debride Joint    POLYPECTOMY  04/29/2018   Procedure: POLYPECTOMY INTESTINAL;  Surgeon: Rogene Houston, MD;  [facility];  Service: Endoscopy;;    Current Medications: Current Meds  Medication Sig   levothyroxine (SYNTHROID) 50 MCG tablet Take 50 mcg by mouth every other day.   levothyroxine (SYNTHROID) 75 MCG tablet Take 75 mcg by mouth every other day.   omeprazole (PRILOSEC) 20 MG capsule Take 1 capsule (20  mg total) by mouth daily.     Allergies:   Prednisone, Amoxicillin-pot clavulanate, and Ciprofloxacin   Social History   Socioeconomic History   Marital status: Married    Spouse name: Not on file   Number of children: Not on file   Years of education: Not on file   Highest education level: Not on file  Occupational History   Not on file  Tobacco Use   Smoking status: Never   Smokeless tobacco: Never  Substance and Sexual Activity   Alcohol use: No    Alcohol/week: 0.0 standard  drinks   Drug use: No   Sexual activity: Not on file  Other Topics Concern   Not on file  Social History Narrative   Not on file   Social Determinants of Health   Financial Resource Strain: Not on file  Food Insecurity: Not on file  Transportation Needs: Not on file  Physical Activity: Not on file  Stress: Not on file  Social Connections: Not on file     Family History: The patient's family history includes Cancer in her father.  CAD and bypass surgery in both her parents, myocardial infarction in her brother  ROS:   Please see the history of present illness.     All other systems reviewed and are negative.  EKGs/Labs/Other Studies Reviewed:    The following studies were reviewed today: Lower extremity ABI, echocardiogram, carotid Dopplers, CT of the abdomen pelvis described above  EKG:  EKG is  ordered today.  The ekg ordered today demonstrates mild sinus bradycardia with occasional PACs and sinus arrhythmia.  Small sharp Q waves are seen in leads II and aVF.  Normal QT intervals 403 ms.  Recent Labs: 08/11/2021: BUN 14; Creatinine, Ser 0.79; Hemoglobin 13.6; Platelets 252; Potassium 4.1; Sodium 138  Recent Lipid Panel    Component Value Date/Time   CHOL 203 (H) 06/04/2015 0219   TRIG 68 06/04/2015 0219   HDL 56 06/04/2015 0219   CHOLHDL 3.6 06/04/2015 0219   VLDL 14 06/04/2015 0219   LDLCALC 133 (H) 06/04/2015 0219   08/29/2020 creatinine 0.92, hemoglobin 14.1, potassium 4.4, TSH 1.63 Cholesterol 228, triglycerides 87, HDL 65, LDL 148  Risk Assessment/Calculations:           Physical Exam:    VS:  BP (!) 111/56    Pulse (!) 59    Ht 5\' 6"  (1.676 m)    Wt 169 lb 12.8 oz (77 kg)    SpO2 98%    BMI 27.41 kg/m     Wt Readings from Last 3 Encounters:  10/05/21 169 lb 12.8 oz (77 kg)  08/11/21 173 lb (78.5 kg)  07/08/18 170 lb (77.1 kg)     GEN: Overweight, well nourished, well developed in no acute distress HEENT: Normal NECK: No JVD; No carotid  bruits LYMPHATICS: No lymphadenopathy CARDIAC: Occasional ectopy on a background of RRR, no murmurs, rubs, gallops RESPIRATORY:  Clear to auscultation without rales, wheezing or rhonchi  ABDOMEN: Soft, non-tender, non-distended MUSCULOSKELETAL:  No edema; No deformity  SKIN: Warm and dry NEUROLOGIC:  Alert and oriented x 3 PSYCHIATRIC:  Normal affect   ASSESSMENT:    1. Bradycardia   2. Hypercholesterolemia   3. Acquired hypothyroidism    PLAN:    In order of problems listed above:  Bradycardia: This is probably age-related sinus node dysfunction with marked worsening due to use of a cholinesterase inhibitor.  She did have some symptoms of bradycardia even before use of  Aricept.  She has been off the medication now for 9 days.  We will have her wear a 3-day rhythm monitor.  At this point it does not appear to be an urgent reason for pacemaker implantation, but this should be considered if she has severe daytime bradycardia or asymptomatic sinus pauses of >3 seconds, or symptomatic sinus pauses > 2 seconds.  Avoid all the medicines with negative chronotropic effect also to include beta-blockers, central acting calcium channel blockers, clonidine, glaucoma drops that contain beta-blocker, etc. Hypercholesterolemia: She does not have clinically symptomatic CAD or PAD and had only mild atherosclerosis described on CT studies performed to look for nephrolithiasis, no evidence of obstruction on ABI or carotid duplex ultrasound.  Would recommend healthy dietary changes.  Borderline indication for medication at this time.  Her only other risk factor for vascular disease is her family history. Hypothyroidism: Clinically euthyroid and with normal recent TSH.           Medication Adjustments/Labs and Tests Ordered: Current medicines are reviewed at length with the patient today.  Concerns regarding medicines are outlined above.  Orders Placed This Encounter  Procedures   LONG TERM MONITOR (3-14  DAYS)   EKG 12-Lead   No orders of the defined types were placed in this encounter.   Patient Instructions  Medication Instructions:  No changes *If you need a refill on your cardiac medications before your next appointment, please call your pharmacy*   Lab Work: None ordered If you have labs (blood work) drawn today and your tests are completely normal, you will receive your results only by: [place] (if you have MyChart) OR A paper copy in the mail If you have any lab test that is abnormal or we need to change your treatment, we will call you to review the results.   Testing/Procedures: [place]- Long Term Monitor Instructions  Your physician has requested you wear a ZIO patch monitor for 3 days.  This is a single patch monitor. [employer] supplies one patch monitor per enrollment. Additional stickers are not available. Please do not apply patch if you will be having a Nuclear Stress Test,  Echocardiogram, Cardiac CT, MRI, or Chest Xray during the period you would be wearing the  monitor. The patch cannot be worn during these tests. You cannot remove and re-apply the  ZIO XT patch monitor.  Your ZIO patch monitor will be mailed 3 day USPS to your address on file. It may take 3-5 days  to receive your monitor after you have been enrolled.  Once you have received your monitor, please review the enclosed instructions. Your monitor  has already been registered assigning a specific monitor serial # to you.  Billing and Patient Assistance Program Information  We have supplied [employer] with any of your insurance information on file for billing purposes. [employer] offers a sliding scale Patient Assistance Program for patients that do not have  insurance, or whose insurance does not completely cover the cost of the ZIO monitor.  You must apply for the Patient Assistance Program to qualify for this discounted rate.  To apply, please call [employer] at [phone], select option 4, select  option 2, ask to apply for  Patient Assistance Program. Theodore Demark will ask your household income, and how many people  are in your household. They will quote your out-of-pocket cost based on that information.  [employer] will also be able to set up a 13-month, interest-free payment plan if needed.  Applying the monitor  Shave hair from upper left chest.  Hold abrader disc by orange tab. Rub abrader in 40 strokes over the upper left chest as  indicated in your monitor instructions.  Clean area with 4 enclosed alcohol pads. Let dry.  Apply patch as indicated in monitor instructions. Patch will be placed under collarbone on left  side of chest with arrow pointing upward.  Rub patch adhesive wings for 2 minutes. Remove Stickels label marked "1". Remove the [place]  label marked "2". Rub patch adhesive wings for 2 additional minutes.  While looking in a mirror, press and release button in center of patch. A small green light will  flash 3-4 times. This will be your only indicator that the monitor has been turned on.  Do not shower for the first 24 hours. You may shower after the first 24 hours.  Press the button if you feel a symptom. You will hear a small click. Record Date, Time and  Symptom in the Patient Logbook.  When you are ready to remove the patch, follow instructions on the last 2 pages of Patient  Logbook. Stick patch monitor onto the last page of Patient Logbook.  Place Patient Logbook in the blue and [place] box. Use locking tab on box and tape box closed  securely. The blue and [place] box has prepaid postage on it. Please place it in the mailbox as  soon as possible. Your physician should have your test results approximately 7 days after the  monitor has been mailed back to [facility].  Call [facility] at [phone] if you have questions regarding  your ZIO XT patch monitor. Call them immediately if you see an orange light blinking on your  monitor.  If your monitor  falls off in less than 4 days, contact our Monitor department at [phone].  If your monitor becomes loose or falls off after 4 days call [employer] at [phone] for  suggestions on securing your monitor    Follow-Up: At [facility], you and your health needs are our priority.  As part of our continuing mission to provide you with exceptional heart care, we have created designated Provider Care Teams.  These Care Teams include your primary Cardiologist (physician) and Advanced Practice Providers (APPs -  Physician Assistants and Nurse Practitioners) who all work together to provide you with the care you need, when you need it.  We recommend signing up for the patient portal called "MyChart".  Sign up information is provided on this After Visit Summary.  MyChart is used to connect with patients for Virtual Visits (Telemedicine).  Patients are able to view lab/test results, encounter notes, upcoming appointments, etc.  Non-urgent messages can be sent to your provider as well.   To learn more about what you can do with MyChart, go to [email or web address].    Your next appointment:   3 month(s)  The format for your next appointment:   In Person  Provider:   Sanda Klein, MD {  I   Signed, Sanda Klein, MD  10/05/2021 4:03 PM    [place]

## 2021-10-05 NOTE — Patient Instructions (Signed)
Medication Instructions:  No changes *If you need a refill on your cardiac medications before your next appointment, please call your pharmacy*   Lab Work: None ordered If you have labs (blood work) drawn today and your tests are completely normal, you will receive your results only by: [place] (if you have MyChart) OR A paper copy in the mail If you have any lab test that is abnormal or we need to change your treatment, we will call you to review the results.   Testing/Procedures: [place] [place]- Long Term Monitor Instructions  Your physician has requested you wear a ZIO patch monitor for 3 days.  This is a single patch monitor. [employer] supplies one patch monitor per enrollment. Additional stickers are not available. Please do not apply patch if you will be having a Nuclear Stress Test,  Echocardiogram, Cardiac CT, MRI, or Chest Xray during the period you would be wearing the  monitor. The patch cannot be worn during these tests. You cannot remove and re-apply the  ZIO XT patch monitor.  Your ZIO patch monitor will be mailed 3 day USPS to your address on file. It may take 3-5 days  to receive your monitor after you have been enrolled.  Once you have received your monitor, please review the enclosed instructions. Your monitor  has already been registered assigning a specific monitor serial # to you.  Billing and Patient Assistance Program Information  We have supplied [employer] with any of your insurance information on file for billing purposes. [employer] offers a sliding scale Patient Assistance Program for patients that do not have  insurance, or whose insurance does not completely cover the cost of the ZIO monitor.  You must apply for the Patient Assistance Program to qualify for this discounted rate.  To apply, please call [employer] at [phone], select option 4, select option 2, ask to apply for  Patient Assistance Program. Theodore Demark will ask your household income, and how many  people  are in your household. They will quote your out-of-pocket cost based on that information.  [employer] will also be able to set up a 102-month, interest-free payment plan if needed.  Applying the monitor   Shave hair from upper left chest.  Hold abrader disc by orange tab. Rub abrader in 40 strokes over the upper left chest as  indicated in your monitor instructions.  Clean area with 4 enclosed alcohol pads. Let dry.  Apply patch as indicated in monitor instructions. Patch will be placed under collarbone on left  side of chest with arrow pointing upward.  Rub patch adhesive wings for 2 minutes. Remove [place] label marked "1". Remove the [place]  label marked "2". Rub patch adhesive wings for 2 additional minutes.  While looking in a mirror, press and release button in center of patch. A small green light will  flash 3-4 times. This will be your only indicator that the monitor has been turned on.  Do not shower for the first 24 hours. You may shower after the first 24 hours.  Press the button if you feel a symptom. You will hear a small click. Record Date, Time and  Symptom in the Patient Logbook.  When you are ready to remove the patch, follow instructions on the last 2 pages of Patient  Logbook. Stick patch monitor onto the last page of Patient Logbook.  Place Patient Logbook in the blue and [place] box. Use locking tab on box and tape box closed  securely. The blue and Dim[employer] box  has prepaid postage on it. Please place it in the mailbox as  soon as possible. Your physician should have your test results approximately 7 days after the  monitor has been mailed back to [facility].  Call [place] at [phone] if you have questions regarding  your ZIO XT patch monitor. Call them immediately if you see an orange light blinking on your  monitor.  If your monitor falls off in less than 4 days, contact our Monitor department at [phone].  If your monitor becomes  loose or falls off after 4 days call [employer] at [phone] for  suggestions on securing your monitor    Follow-Up: At [facility], you and your health needs are our priority.  As part of our continuing mission to provide you with exceptional heart care, we have created designated Provider Care Teams.  These Care Teams include your primary Cardiologist (physician) and Advanced Practice Providers (APPs -  Physician Assistants and Nurse Practitioners) who all work together to provide you with the care you need, when you need it.  We recommend signing up for the patient portal called "MyChart".  Sign up information is provided on this After Visit Summary.  MyChart is used to connect with patients for Virtual Visits (Telemedicine).  Patients are able to view lab/test results, encounter notes, upcoming appointments, etc.  Non-urgent messages can be sent to your provider as well.   To learn more about what you can do with MyChart, go to [email or web address].    Your next appointment:   3 month(s)  The format for your next appointment:   In Person  Provider:   Sanda Klein, MD {  I

## 2021-10-12 DIAGNOSIS — R001 Bradycardia, unspecified: Secondary | ICD-10-CM | POA: Diagnosis not present

## 2021-10-24 DIAGNOSIS — R001 Bradycardia, unspecified: Secondary | ICD-10-CM | POA: Diagnosis not present

## 2021-11-23 DIAGNOSIS — S39012A Strain of muscle, fascia and tendon of lower back, initial encounter: Secondary | ICD-10-CM | POA: Diagnosis not present

## 2021-11-23 DIAGNOSIS — Z6827 Body mass index (BMI) 27.0-27.9, adult: Secondary | ICD-10-CM | POA: Diagnosis not present

## 2021-11-23 DIAGNOSIS — I10 Essential (primary) hypertension: Secondary | ICD-10-CM | POA: Diagnosis not present

## 2021-11-23 DIAGNOSIS — M545 Low back pain, unspecified: Secondary | ICD-10-CM | POA: Diagnosis not present

## 2021-11-25 ENCOUNTER — Emergency Department (HOSPITAL_COMMUNITY): Payer: Medicare HMO

## 2021-11-25 ENCOUNTER — Encounter (HOSPITAL_COMMUNITY): Payer: Self-pay | Admitting: *Deleted

## 2021-11-25 ENCOUNTER — Emergency Department (HOSPITAL_COMMUNITY)
Admission: EM | Admit: 2021-11-25 | Discharge: 2021-11-25 | Disposition: A | Discharge status: Home / Self Care | Payer: Medicare HMO | Attending: Emergency Medicine | Admitting: Emergency Medicine

## 2021-11-25 DIAGNOSIS — M545 Low back pain, unspecified: Secondary | ICD-10-CM | POA: Insufficient documentation

## 2021-11-25 DIAGNOSIS — M533 Sacrococcygeal disorders, not elsewhere classified: Secondary | ICD-10-CM | POA: Diagnosis not present

## 2021-11-25 DIAGNOSIS — M5459 Other low back pain: Secondary | ICD-10-CM | POA: Diagnosis not present

## 2021-11-25 MED ORDER — LIDOCAINE 5 % EX PTCH
1.0000 | MEDICATED_PATCH | CUTANEOUS | 0 refills | Status: DC
Start: 2021-11-25 — End: 2021-12-14

## 2021-11-25 NOTE — ED Provider Notes (Signed)
With ?[facility] ?Provider Note ? ? ?CSN: [id number] ?Arrival date & time: 11/25/21  1241 ? ?  ? ?History ?Chief Complaint  ?Patient presents with  ? Back Pain  ? ? ?[name] [name] is a 76 y.o. female with history of anxiety and GERD who presents to the emergency department today with lower back pain that has been ongoing since Monday.  Patient states she was watching her 86-year-old granddaughter over the weekend and bent down to pick her up.  This is the only event that she can recall that may have anything to do with her lower back pain.  She denies any other injury or trauma.  Since then has been hurting of the lower back.  She denies any radiation of her symptoms to the abdomen or lower legs.  No focal weakness or numbness to the lower legs.  No bowel or bladder incontinence.  No dysuria, urinary frequency or urgency.  No fever or chills.  Patient was seen and evaluated by her PCP on Wednesday and thought to be muscular spasm.  She was given cyclobenzaprine and meloxicam with little relief.  Pain is constant and worse with movement. ? ? ?Back Pain ? ?  ? ?Home Medications ?Prior to Admission medications   ?Medication Sig Start Date End Date Taking? Authorizing Provider  ?acetaminophen (TYLENOL 8 HOUR) 650 MG CR tablet Take 1 tablet (650 mg total) by mouth every 8 (eight) hours. 07/08/18  Yes Varney Biles, MD  ?baclofen (LIORESAL) 10 MG tablet Take 10 mg by mouth 2 (two) times daily as needed. 08/17/21  Yes [provider]  ?cyclobenzaprine (FLEXERIL) 10 MG tablet Take 5-10 mg by mouth 3 (three) times daily as needed. 11/23/21  Yes [provider]  ?ibuprofen (ADVIL,MOTRIN) 200 MG tablet Take 200 mg by mouth every 6 (six) hours as needed (pain).   Yes [provider]  ?levothyroxine (SYNTHROID) 50 MCG tablet Take 50 mcg by mouth every other day. 08/31/21  Yes [provider]  ?levothyroxine (SYNTHROID) 75 MCG tablet Take 75 mcg by mouth every other day. 08/31/21   Yes [provider]  ?lidocaine (LIDODERM) 5 % Place 1 patch onto the skin daily. Remove & Discard patch within 12 hours or as directed by MD 11/25/21  Yes Raul Del, Zylee Marchiano M, PA-C  ?meloxicam (MOBIC) 15 MG tablet Take 15 mg by mouth daily. 11/23/21  Yes [provider]  ?omeprazole (PRILOSEC) 20 MG capsule Take 1 capsule (20 mg total) by mouth daily. 06/05/15  Yes Barton Dubois, MD  ?cefdinir (OMNICEF) 300 MG capsule Take 300 mg by mouth 2 (two) times daily. ?Patient not taking: Reported on 11/25/2021 08/17/21   [provider]  ?methocarbamol (ROBAXIN) 500 MG tablet Take 1 tablet (500 mg total) by mouth every 6 (six) hours as needed for muscle spasms. ?Patient not taking: Reported on 10/05/2021 07/08/18   Francine Graven, DO  ?valACYclovir (VALTREX) 1000 MG tablet Take 1 tablet (1,000 mg total) by mouth 3 (three) times daily. ?Patient not taking: Reported on 10/05/2021 08/11/21   Evalee Jefferson, PA-C  ?   ? ?Allergies    ?Prednisone, Amoxicillin-pot clavulanate, and Ciprofloxacin   ? ?Review of Systems   ?Review of Systems  ?Musculoskeletal:  Positive for back pain.  ?All other systems reviewed and are negative. ? ?Physical Exam ?Updated Vital Signs ?BP 134/72   Pulse (!) 51   Temp 98 ?F (36.7 ?C)   Resp 16   Ht '5\' 6"'$  (1.676 m)  Wt 76.7 kg   SpO2 100%   BMI 27.28 kg/m?  ?Physical Exam ?Vitals and nursing note reviewed.  ?Constitutional:   ?   General: She is not in acute distress. ?   Appearance: Normal appearance.  ?HENT:  ?   Head: Normocephalic and atraumatic.  ?Eyes:  ?   General:     ?   Right eye: No discharge.     ?   Left eye: No discharge.  ?Cardiovascular:  ?   Comments: Regular rate and rhythm.  S1/S2 are distinct without any evidence of murmur, rubs, or gallops.  Radial pulses are 2+ bilaterally.  Dorsalis pedis pulses are 2+ bilaterally.  No evidence of pedal edema. ?Pulmonary:  ?   Comments: Clear to auscultation bilaterally.  Normal effort.  No respiratory distress.  No  evidence of wheezes, rales, or rhonchi heard throughout. ?Abdominal:  ?   General: Abdomen is flat. Bowel sounds are normal. There is no distension.  ?   Tenderness: There is no abdominal tenderness. There is no guarding or rebound.  ?Musculoskeletal:     ?   General: Normal range of motion.  ?   Cervical back: Neck supple.  ?   Comments: There is tenderness over the lumbar spine.  5/5 strength to the lower extremities.  Normal sensation to lower extremities.  ?Skin: ?   General: Skin is warm and dry.  ?   Findings: No rash.  ?Neurological:  ?   General: No focal deficit present.  ?   Mental Status: She is alert.  ?Psychiatric:     ?   Mood and Affect: Mood normal.     ?   Behavior: Behavior normal.  ? ? ?ED Results / Procedures / Treatments   ?Labs ?(all labs ordered are listed, but only abnormal results are displayed) ?Labs Reviewed - No data to display ? ?EKG ?None ? ?Radiology ?DG Lumbar Spine Complete ? ?Result Date: 11/25/2021 ?CLINICAL DATA:  Acute lower back pain. EXAM: LUMBAR SPINE - COMPLETE 4+ VIEW COMPARISON:  April 22, 2018. FINDINGS: No fracture is noted. Grade 1 anterolisthesis of L4-5 is noted secondary to posterior facet joint hypertrophy. Mild degenerative disc disease is noted at L1-2 and L2-3. Moderate degenerative disc disease is noted at L5-S1. IMPRESSION: Multilevel degenerative disc disease.  No acute abnormality seen. Electronically Signed   By: Marijo Conception M.D.   On: 11/25/2021 14:50  ? ?DG Sacrum/Coccyx ? ?Result Date: 11/25/2021 ?CLINICAL DATA:  Acute lower back pain. EXAM: SACRUM AND COCCYX - 2+ VIEW COMPARISON:  None. FINDINGS: Sacroiliac joints are unremarkable. Lateral projection of the sacrum was not obtained and therefore fracture cannot be excluded on the basis of this exam. IMPRESSION: Lateral projection of the sacrum was not obtained and therefore fracture cannot be excluded on the basis of this exam. Visualized portions of sacroiliac joints are unremarkable. Electronically  Signed   By: Marijo Conception M.D.   On: 11/25/2021 14:51   ? ?Procedures ?Procedures  ? ? ?Medications Ordered in ED ?Medications - No data to display ? ?ED Course/ Medical Decision Making/ A&P ?  ?                        ?Medical Decision Making ?Amount and/or Complexity of Data Reviewed ?Radiology: ordered. ? ?Risk ?Prescription drug management. ? ? ?This patient presents to the ED for concern of back pain, this involves an extensive number of treatment options, and is a complaint  that carries with it a high risk of complications and morbidity.  The differential diagnosis includes musculoskeletal spasm, spinal fracture although unlikely given the mechanism and lack of trauma or injury.  Doubt sciatica. ? ? ?Co morbidities that complicate the patient evaluation ? ?Past Medical History:  ?Diagnosis Date  ? Anxiety   ? GERD (gastroesophageal reflux disease)   ? Thyroid disease   ? ? ?Additional history obtained: ? ?Additional history obtained from nursing note ?External records from outside source obtained and reviewed including imaging of the spine 3 years ago which was negative. ? ? ?Lab Tests: ? ?I Ordered, and personally interpreted labs.  The pertinent results include:  None ? ? ?Imaging Studies ordered: ? ?I ordered imaging studies including x-ray of the lumbar spine ?I independently visualized and interpreted imaging which showed no fracture ?I agree with the radiologist interpretation ? ? ?Cardiac Monitoring: ? ?The patient was maintained on a cardiac monitor.  I personally viewed and interpreted the cardiac monitored which showed an underlying rhythm of: Normal sinus rhythm ? ? ?Medicines ordered and prescription drug management: ? ?None ? ? ?Test Considered: ? ?N/A ? ? ?Critical Interventions: ? ?N/A ? ? ?Problem List / ED Course: ? ?Patient presenting today with lower back pain which seems to be musculoskeletal spasm.  I doubt any fracture or dislocation.  Patient and significant other were adamant about  imaging.  Imaging did not show any signs of fracture or dislocation.  I doubt coccyx fracture.  We will add Lidoderm patches to go home with.  We will have her follow-up with primary care.  She is safe for disc

## 2021-11-25 NOTE — ED Triage Notes (Signed)
Pain in left side of back for 5 days ?

## 2021-11-25 NOTE — Discharge Instructions (Signed)
No evidence of fractures on imaging today which is reassuring.  Please continue taking the medications as prescribed.  I will also provide you with lidocaine patches.  You can take this twice daily as needed for pain.  I would like for you to follow-up with your primary care provider sometime next week for follow-up.  Return to the emergency department for any worsening symptoms you might have. ?

## 2021-11-26 DIAGNOSIS — R3129 Other microscopic hematuria: Secondary | ICD-10-CM | POA: Diagnosis not present

## 2021-11-26 DIAGNOSIS — R109 Unspecified abdominal pain: Secondary | ICD-10-CM | POA: Diagnosis not present

## 2021-11-29 DIAGNOSIS — E7849 Other hyperlipidemia: Secondary | ICD-10-CM | POA: Diagnosis not present

## 2021-11-29 DIAGNOSIS — R2681 Unsteadiness on feet: Secondary | ICD-10-CM | POA: Diagnosis not present

## 2021-11-29 DIAGNOSIS — Z6827 Body mass index (BMI) 27.0-27.9, adult: Secondary | ICD-10-CM | POA: Diagnosis not present

## 2021-11-29 DIAGNOSIS — R69 Illness, unspecified: Secondary | ICD-10-CM | POA: Diagnosis not present

## 2021-11-29 DIAGNOSIS — R531 Weakness: Secondary | ICD-10-CM | POA: Diagnosis not present

## 2021-11-29 DIAGNOSIS — M545 Low back pain, unspecified: Secondary | ICD-10-CM | POA: Diagnosis not present

## 2021-11-29 DIAGNOSIS — E039 Hypothyroidism, unspecified: Secondary | ICD-10-CM | POA: Diagnosis not present

## 2021-12-05 ENCOUNTER — Ambulatory Visit: Payer: Medicare HMO | Admitting: Internal Medicine

## 2021-12-05 ENCOUNTER — Encounter: Payer: Self-pay | Admitting: Internal Medicine

## 2021-12-05 VITALS — BP 114/78 | HR 60 | Ht 66.0 in | Wt 165.8 lb

## 2021-12-05 DIAGNOSIS — E039 Hypothyroidism, unspecified: Secondary | ICD-10-CM | POA: Diagnosis not present

## 2021-12-05 DIAGNOSIS — R531 Weakness: Secondary | ICD-10-CM

## 2021-12-05 DIAGNOSIS — R6889 Other general symptoms and signs: Secondary | ICD-10-CM | POA: Diagnosis not present

## 2021-12-05 LAB — PROLACTIN: Prolactin: 3.6 ng/mL

## 2021-12-05 LAB — FOLLICLE STIMULATING HORMONE: FSH: 75.5 m[IU]/mL

## 2021-12-05 LAB — TSH: TSH: 5.45 u[IU]/mL (ref 0.35–5.50)

## 2021-12-05 NOTE — Progress Notes (Signed)
? ? ?Name: [name] [name]  ?MRN/ DOB: [id number], 08-14-1946    ?Age/ Sex: 76 y.o., female   ? ?PCP: Curlene Labrum, MD   ?Reason for Endocrinology Evaluation: Hypothyroidism  ?   ?Date of Initial Endocrinology Evaluation: 12/05/2021   ? ? ?HPI: ?[name] is a 76 y.o. female with a past medical history of GERD, and anxiety. The patient presented for initial endocrinology clinic visit on 12/05/2021 for consultative assistance with her Hypothyroidism.  ? ?Patient has been diagnosed with hypothyroidism a few years  ? ?She is here to discuss changes Temperature as it fluctuates between 94- 97 F, she attributes  ?She denies prior thyroid sx or radiation  ?Denies head injury  ?Denies local neck symptoms  ?No prior dx of  ? ?Weight has been stable  ?Denies constipation or diarrhea  ?Denies palpitations  ?Has occasional tremors  ? ? ? ?Levothyroxine 75 mcg alternating with levothyroxine 50 mcg  ? ? ?Of note, the patient follows with cardiology for bradycardia ? ?Daughter with thyroid disease  ? ? ?HISTORY:  ?Past Medical History:  ?Past Medical History:  ?Diagnosis Date  ? Anxiety   ? GERD (gastroesophageal reflux disease)   ? Thyroid disease   ? ?Past Surgical History:  ?Past Surgical History:  ?Procedure Laterality Date  ? ABDOMINAL HYSTERECTOMY    ? BLADDER REPAIR    ? COLONOSCOPY N/A 04/29/2018  ? Procedure: COLONOSCOPY;  Surgeon: Rogene Houston, MD;  [facility];  Service: Endoscopy;  Laterality: N/A;  2:35  ? DILATION AND CURETTAGE OF UTERUS    ? times 4  ? I & D EXTREMITY  06/28/2012  ? Procedure: MINOR IRRIGATION AND DEBRIDEMENT EXTREMITY;  Surgeon: Cammie Sickle., MD;  Location: [facility];  Service: Orthopedics;  Laterality: Right;  Debride Mucoid Cyst, Debride Joint   ? POLYPECTOMY  04/29/2018  ? Procedure: POLYPECTOMY INTESTINAL;  Surgeon: Rogene Houston, MD;  Location: [facility];  Service: Endoscopy;;  ?  ?Social History:  reports that she has never smoked. She has  never used smokeless tobacco. She reports that she does not drink alcohol and does not use drugs. ?Family History: family history includes Cancer in her father. ? ? ?HOME MEDICATIONS: ?Allergies as of 12/05/2021   ? ?   Reactions  ? Prednisone Other (See Comments)  ? Rectal bleeding ?Other reaction(s): Other (See Comments) ?Rectal bleeding  ? Amoxicillin-pot Clavulanate Hives, Rash  ? Ciprofloxacin Rash  ? ?  ? ?  ?Medication List  ?  ? ?  ? Accurate as of December 05, 2021 12:03 PM. If you have any questions, ask your nurse or doctor.  ?  ?  ? ?  ? ?STOP taking these medications   ? ?baclofen 10 MG tablet ?Commonly known as: LIORESAL ?Stopped by: Dorita Sciara, MD ?  ?cyclobenzaprine 10 MG tablet ?Commonly known as: FLEXERIL ?Stopped by: Dorita Sciara, MD ?  ? ?  ? ?TAKE these medications   ? ?acetaminophen 650 MG CR tablet ?Commonly known as: Tylenol 8 Hour ?Take 1 tablet (650 mg total) by mouth every 8 (eight) hours. ?  ?cefdinir 300 MG capsule ?Commonly known as: OMNICEF ?Take 300 mg by mouth 2 (two) times daily. ?  ?ibuprofen 200 MG tablet ?Commonly known as: ADVIL ?Take 200 mg by mouth every 6 (six) hours as needed (pain). ?  ?levothyroxine 50 MCG tablet ?Commonly known as: SYNTHROID ?Take 50 mcg by mouth every other  day. ?  ?levothyroxine 75 MCG tablet ?Commonly known as: SYNTHROID ?Take 75 mcg by mouth every other day. ?  ?lidocaine 5 % ?Commonly known as: Lidoderm ?Place 1 patch onto the skin daily. Remove & Discard patch within 12 hours or as directed by MD ?  ?meloxicam 15 MG tablet ?Commonly known as: MOBIC ?Take 15 mg by mouth daily. ?  ?methocarbamol 500 MG tablet ?Commonly known as: ROBAXIN ?Take 1 tablet (500 mg total) by mouth every 6 (six) hours as needed for muscle spasms. ?  ?omeprazole 20 MG capsule ?Commonly known as: PRILOSEC ?Take 1 capsule (20 mg total) by mouth daily. ?  ?ondansetron 4 MG disintegrating tablet ?Commonly known as: ZOFRAN-ODT ?Take by mouth. ?  ?traMADol 50 MG  tablet ?Commonly known as: ULTRAM ?Take by mouth. ?  ?valACYclovir 1000 MG tablet ?Commonly known as: VALTREX ?Take 1 tablet (1,000 mg total) by mouth 3 (three) times daily. ?  ? ?  ?  ? ? ?REVIEW OF SYSTEMS: ?A comprehensive ROS was conducted with the patient and is negative except as per HPI  ? ? ?OBJECTIVE:  ?VS: BP 114/78 (BP Location: Left Arm, Patient Position: Sitting, Cuff Size: Small)   Pulse 60   Ht '5\' 6"'$  (1.676 m)   Wt 165 lb 12.8 oz (75.2 kg)   SpO2 94%   BMI 26.76 kg/m?   ? ?Wt Readings from Last 3 Encounters:  ?12/05/21 165 lb 12.8 oz (75.2 kg)  ?11/25/21 169 lb (76.7 kg)  ?10/05/21 169 lb 12.8 oz (77 kg)  ? ? ? ?EXAM: ?General: Pt appears well and is in NAD  ?Neck: General: Supple without adenopathy. ?Thyroid: Thyroid size normal.  No goiter or nodules appreciated.   ?Lungs: Clear with good BS bilat with no rales, rhonchi, or wheezes  ?Heart: Auscultation: RRR.  ?Abdomen: Normoactive bowel sounds, soft, nontender, without masses or organomegaly palpable  ?Extremities:  ?BL LE: No pretibial edema normal ROM and strength.  ?Mental Status: Judgment, insight: Intact ?Orientation: Oriented to time, place, and person ?Mood and affect: No depression, anxiety, or agitation  ? ? ? ?DATA REVIEWED: ? ?  ? Latest Reference Range & Units 12/05/21 12:14  ?FSH mIU/ML 75.5  ?Prolactin ng/mL 3.6  ?TSH 0.35 - 5.50 uIU/mL 5.45  ? ?08/30/2021 ?TSH 1.630 uIU/mL  ?ASSESSMENT/PLAN/RECOMMENDATIONS:  ? ?Hypothyroidism: ? ?-Patient is clinically euthyroid ?-No local neck symptoms ?-- Pt educated extensively on the correct way to take levothyroxine (first thing in the morning with water, 30 minutes before eating or taking other medications). ?- Pt encouraged to double dose the following day if she were to miss a dose given long half-life of levothyroxine. ?-Per daughter the patient does seldom forget to take her levothyroxine, she also has been taking it close to vitamin intake, her TSH is slightly elevated but I would not  recommend making any changes until she starts taking it appropriately, her TSH 3 months ago was normal at 1.630 u U IU/mL and this drastic change in TSH within just a few months is either explained by imperfect adherence or barriers of absorption such as multivitamins ?-She may have that rechecked by her PCP and make the appropriate adjustments ? ?Medications : ?Continue levothyroxine 50 mcg alternating with levothyroxine 75 mcg ? ? ?2.  Alteration in body temperature: ? ?-The patient has a habit of checking her body temperature and it fluctuates between 95-97 Fahrenheit ?-I explained to the patient that this is beyond the scope of my practice and I do not  see a reason for people to check the temperature unless they have a fever, we also discussed the accuracy of thermometers in general also we also discussed the appropriate spacing from the time she eats/drinks and how this affect her temperature ?-She does get episodes of weakness and that she attributes to body temperature change?  But I did explain to the patient that she needs to start checking her blood pressure and evening glucose if necessary but I personally cannot attribute her weakness episodes to a temperature change ?-FSH and prolactin are appropriate ? ? ?Patient to return to follow-up with PCP ?Signed electronically by: ?Abby Nena Jordan, MD ? ?[facility]  ?[facility] ?[address] ?[place],  [address] ?Phone: [phone] ?FAX: [phone] ? ? ?CC: ?Burdine, Virgina Evener, MD ?[address] ?[place] [address] ?Phone: [phone] ?Fax: [phone] ? ? ?Return to Endocrinology clinic as below: ?Future Appointments  ?Date Time Provider [place]  ?12/14/2021  1:30 PM Croitoru, Dani Gobble, MD [facility]  ?  ? ? ? ? ? ?

## 2021-12-05 NOTE — Patient Instructions (Signed)

## 2021-12-07 DIAGNOSIS — E782 Mixed hyperlipidemia: Secondary | ICD-10-CM | POA: Diagnosis not present

## 2021-12-07 DIAGNOSIS — E039 Hypothyroidism, unspecified: Secondary | ICD-10-CM | POA: Diagnosis not present

## 2021-12-07 DIAGNOSIS — E7849 Other hyperlipidemia: Secondary | ICD-10-CM | POA: Diagnosis not present

## 2021-12-07 DIAGNOSIS — I10 Essential (primary) hypertension: Secondary | ICD-10-CM | POA: Diagnosis not present

## 2021-12-09 ENCOUNTER — Telehealth: Payer: Self-pay | Admitting: Cardiovascular Disease

## 2021-12-09 DIAGNOSIS — R69 Illness, unspecified: Secondary | ICD-10-CM | POA: Diagnosis not present

## 2021-12-09 DIAGNOSIS — R5383 Other fatigue: Secondary | ICD-10-CM | POA: Diagnosis not present

## 2021-12-09 DIAGNOSIS — F5104 Psychophysiologic insomnia: Secondary | ICD-10-CM | POA: Diagnosis not present

## 2021-12-09 DIAGNOSIS — R2681 Unsteadiness on feet: Secondary | ICD-10-CM | POA: Diagnosis not present

## 2021-12-09 DIAGNOSIS — I495 Sick sinus syndrome: Secondary | ICD-10-CM | POA: Diagnosis not present

## 2021-12-09 DIAGNOSIS — E7849 Other hyperlipidemia: Secondary | ICD-10-CM | POA: Diagnosis not present

## 2021-12-09 DIAGNOSIS — R531 Weakness: Secondary | ICD-10-CM | POA: Diagnosis not present

## 2021-12-09 DIAGNOSIS — E039 Hypothyroidism, unspecified: Secondary | ICD-10-CM | POA: Diagnosis not present

## 2021-12-09 DIAGNOSIS — F0390 Unspecified dementia without behavioral disturbance: Secondary | ICD-10-CM | POA: Diagnosis not present

## 2021-12-09 NOTE — Telephone Encounter (Signed)
Spoke with [name] [name], office note from 2/22 faxed to [facility] at [phone]. Aware medication recommendations were because of bradycardia. ?

## 2021-12-09 NOTE — Telephone Encounter (Signed)
Pt c/o medication issue: ? ?1. Name of Medication: All Dementia Medicine  ? ?2. How are you currently taking this medication (dosage and times per day)? Patient not taking anything  ? ?3. Are you having a reaction (difficulty breathing--STAT)? ? ?4. What is your medication issue? Patient and her daughter told PCP that the patient was told to avoid all Dementia medications due to her heart condition. PCP office would like clarification regarding these orders. Please ask for Angela Nevin , Dr. Burdine's Nurse  ?

## 2021-12-13 DIAGNOSIS — R2681 Unsteadiness on feet: Secondary | ICD-10-CM | POA: Diagnosis not present

## 2021-12-13 DIAGNOSIS — R29898 Other symptoms and signs involving the musculoskeletal system: Secondary | ICD-10-CM | POA: Diagnosis not present

## 2021-12-13 DIAGNOSIS — M545 Low back pain, unspecified: Secondary | ICD-10-CM | POA: Diagnosis not present

## 2021-12-13 DIAGNOSIS — Z7409 Other reduced mobility: Secondary | ICD-10-CM | POA: Diagnosis not present

## 2021-12-13 DIAGNOSIS — Z789 Other specified health status: Secondary | ICD-10-CM | POA: Diagnosis not present

## 2021-12-14 ENCOUNTER — Ambulatory Visit: Payer: Medicare HMO | Admitting: Cardiovascular Disease

## 2021-12-14 ENCOUNTER — Encounter: Payer: Self-pay | Admitting: Cardiovascular Disease

## 2021-12-14 VITALS — BP 118/90 | HR 76 | Ht 66.0 in | Wt 164.8 lb

## 2021-12-14 DIAGNOSIS — R001 Bradycardia, unspecified: Secondary | ICD-10-CM | POA: Diagnosis not present

## 2021-12-14 NOTE — Patient Instructions (Signed)

## 2021-12-14 NOTE — Progress Notes (Signed)
?Cardiology Office Note:   ? ?Date:  12/14/2021  ? ?ID:  [name] [name], DOB [birth year], MRN [id number] ? ?PCP:  Curlene Labrum, MD ?  ?[facility] Providers ?Cardiologist:  Sanda Klein, MD    ? ?Referring MD: Curlene Labrum, MD  ? ?Chief Complaint  ?Patient presents with  ? Bradycardia  ? ? ?History of Present Illness:   ? ?[name] [name] is a 76 y.o. female with a hx of generally good health, nephrolithiasis, mild Alzheimer's disease, treated hypothyroidism, has had recent problems with presyncope and bradycardia, worsened while taking Aricept. ? ?After stopping the Aricept she has much more energy and no longer complains of fatigue and weakness.  Her arrhythmia monitor showed substantial problems with bradycardia and chronotropic incompetence.  Unfortunately stopping the Aricept has also led to some deterioration in her short-term memory again. ? ?The patient specifically denies any chest pain at rest exertion, dyspnea at rest or with exertion, orthopnea, paroxysmal nocturnal dyspnea, syncope, palpitations, focal neurological deficits, intermittent claudication, lower extremity edema, unexplained weight gain, cough, hemoptysis or wheezing. ? ?Her TSH is normal on the current dose of levothyroxine (50 mcg alternating with 75 mcg every other day). ? ?Carotid duplex ultrasound in 2016 showed minor intimal thickening without obstruction and patent vertebral with antegrade flow bilaterally.  Previous CT of the abdomen pelvis showed mild aortic atherosclerosis and aortoiliac branch vessel atherosclerosis.  Normal ABI study in 2011.  Echocardiogram for syncope/TIA in 2016 showed normal left ventricular systolic function with EF 60 to 65% and normal regional wall motion.  Left atrium was normal in size and there was no meaningful valvular problem. ? ?Past Medical History:  ?Diagnosis Date  ? Anxiety   ? GERD (gastroesophageal reflux disease)   ? Thyroid disease   ? ? ?Past Surgical History:  ?Procedure Laterality  Date  ? ABDOMINAL HYSTERECTOMY    ? BLADDER REPAIR    ? COLONOSCOPY N/A 04/29/2018  ? Procedure: COLONOSCOPY;  Surgeon: Rogene Houston, MD;  [facility];  Service: Endoscopy;  Laterality: N/A;  2:35  ? DILATION AND CURETTAGE OF UTERUS    ? times 4  ? I & D EXTREMITY  06/28/2012  ? Procedure: MINOR IRRIGATION AND DEBRIDEMENT EXTREMITY;  Surgeon: Cammie Sickle., MD;  Location: [facility];  Service: Orthopedics;  Laterality: Right;  Debride Mucoid Cyst, Debride Joint   ? POLYPECTOMY  04/29/2018  ? Procedure: POLYPECTOMY INTESTINAL;  Surgeon: Rogene Houston, MD;  [facility];  Service: Endoscopy;;  ? ? ?Current Medications: ?Current Meds  ?Medication Sig  ? levothyroxine (SYNTHROID) 50 MCG tablet Take 50 mcg by mouth every other day.  ? levothyroxine (SYNTHROID) 75 MCG tablet Take 75 mcg by mouth every other day.  ? omeprazole (PRILOSEC) 20 MG capsule Take 1 capsule (20 mg total) by mouth daily.  ?  ? ?Allergies:   Prednisone, Amoxicillin-pot clavulanate, and Ciprofloxacin  ? ?Social History  ? ?Socioeconomic History  ? Marital status: Married  ?  Spouse name: Not on file  ? Number of children: Not on file  ? Years of education: Not on file  ? Highest education level: Not on file  ?Occupational History  ? Not on file  ?Tobacco Use  ? Smoking status: Never  ? Smokeless tobacco: Never  ?Substance and Sexual Activity  ? Alcohol use: No  ?  Alcohol/week: 0.0 standard drinks  ? Drug use: No  ? Sexual activity: Not on file  ?Other  Topics Concern  ? Not on file  ?Social History Narrative  ? Not on file  ? ?Social Determinants of Health  ? ?Financial Resource Strain: Not on file  ?Food Insecurity: Not on file  ?Transportation Needs: Not on file  ?Physical Activity: Not on file  ?Stress: Not on file  ?Social Connections: Not on file  ?  ? ?Family History: ?The patient's family history includes Cancer in her father.  CAD and bypass surgery in both her parents, myocardial infarction in  her brother ? ?ROS:   ?Please see the history of present illness.    ? All other systems reviewed and are negative. ? ?EKGs/Labs/Other Studies Reviewed:   ? ?The following studies were reviewed today: ?Event monitor 10/24/2021 ?Dominant rhythm is marked sinus bradycardia with a minimum heart rate of 42 bpm and an average heart rate of 58 bpm.. There appears to be blunted circadian variation. During sinus rhythm, the heart rate rarely approaches 100 bpm. ?Frequent PACs are seen (roughly 11% of all beats) there were occasional brief episodes of nonsustained atrial tachycardia, maximum 11 beats. Atrial ectopy and episodes of atrial tachycardia are commonly seen at night. ?There are no episodes of significant ventricular arrhythmia and no evidence of AV conduction abnormalities. ?  ?Abnormal arrhythmia monitor due to the presence of persistent and moderate to severe sinus bradycardia with suggestion of chronotropic incompetence. ?There are frequent episodes of atrial ectopy and brief nonsustained atrial tachycardia, but no atrial fibrillation is seen. ?She is no longer receiving any medications with negative chronotropic effect.  She is on levothyroxine supplement, but a recent TSH evaluation is not available for review. ?If the patient is euthyroid, the findings are most consistent with age-related sinus node dysfunction (sick sinus syndrome). ?Pacemaker therapy can be considered if the patient has prominent complaints of fatigue/exertional intolerance. ?  ?EKG:  EKG is  ordered today.  The ekg ordered today demonstrates mild sinus bradycardia with occasional PACs and sinus arrhythmia.  Small sharp Q waves are seen in leads II and aVF.  Normal QT intervals 403 ms. ? ?Recent Labs: ?08/11/2021: BUN 14; Creatinine, Ser 0.79; Hemoglobin 13.6; Platelets 252; Potassium 4.1; Sodium 138 ?12/05/2021: TSH 5.45  ?Recent Lipid Panel ?   ?Component Value Date/Time  ? CHOL 203 (H) 06/04/2015 0219  ? TRIG 68 06/04/2015 0219  ? HDL 56  06/04/2015 0219  ? CHOLHDL 3.6 06/04/2015 0219  ? VLDL 14 06/04/2015 0219  ? LDLCALC 133 (H) 06/04/2015 0219  ? ?08/29/2020 creatinine 0.92, hemoglobin 14.1, potassium 4.4, TSH 1.63 ?Cholesterol 228, triglycerides 87, HDL 65, LDL 148 ? ?12/07/2021 ?Cholesterol 192, HDL 63, triglycerides 65 ?Creatinine 0.89, ALT 12, TSH 3.11 ? ?Risk Assessment/Calculations:   ?  ? ?    ? ?Physical Exam:   ? ?VS:  BP 118/90 (BP Location: Left Arm, Patient Position: Sitting, Cuff Size: Large)   Pulse 76   Ht '5\' 6"'$  (1.676 m)   Wt 164 lb 12.8 oz (74.8 kg)   SpO2 99%   BMI 26.60 kg/m?    ? ?Wt Readings from Last 3 Encounters:  ?12/14/21 164 lb 12.8 oz (74.8 kg)  ?12/05/21 165 lb 12.8 oz (75.2 kg)  ?11/25/21 169 lb (76.7 kg)  ?  ? ? ?General: Alert, oriented x3, no distress ?Head: no evidence of trauma, PERRL, EOMI, no exophtalmos or lid lag, no myxedema, no xanthelasma; normal ears, nose and oropharynx ?Neck: normal jugular venous pulsations and no hepatojugular reflux; brisk carotid pulses without delay and no carotid bruits ?  Chest: clear to auscultation, no signs of consolidation by percussion or palpation, normal fremitus, symmetrical and full respiratory excursions ?Cardiovascular: normal position and quality of the apical impulse, regular rhythm, normal first and second heart sounds, no murmurs, rubs or gallops ?Abdomen: no tenderness or distention, no masses by palpation, no abnormal pulsatility or arterial bruits, normal bowel sounds, no hepatosplenomegaly ?Extremities: no clubbing, cyanosis or edema; 2+ radial, ulnar and brachial pulses bilaterally; 2+ right femoral, posterior tibial and dorsalis pedis pulses; 2+ left femoral, posterior tibial and dorsalis pedis pulses; no subclavian or femoral bruits ?Neurological: grossly nonfocal ?Psych: Normal mood and affect ? ? ?ASSESSMENT:   ? ?No diagnosis found. ? ?PLAN:   ? ?In order of problems listed above: ? ?Bradycardia: Has improved (and her symptoms have improved in parallel)  after stopping the cholinesterase inhibitor.  I offered the option of restarting Aricept, but will also need to have a dual-chamber permanent pacemaker to compensate for the bradycardia.  We discussed the pr

## 2021-12-20 DIAGNOSIS — M545 Low back pain, unspecified: Secondary | ICD-10-CM | POA: Diagnosis not present

## 2021-12-20 DIAGNOSIS — Z789 Other specified health status: Secondary | ICD-10-CM | POA: Diagnosis not present

## 2021-12-20 DIAGNOSIS — Z7409 Other reduced mobility: Secondary | ICD-10-CM | POA: Diagnosis not present

## 2021-12-20 DIAGNOSIS — R2681 Unsteadiness on feet: Secondary | ICD-10-CM | POA: Diagnosis not present

## 2021-12-20 DIAGNOSIS — R29898 Other symptoms and signs involving the musculoskeletal system: Secondary | ICD-10-CM | POA: Diagnosis not present

## 2022-01-19 DIAGNOSIS — E7849 Other hyperlipidemia: Secondary | ICD-10-CM | POA: Diagnosis not present

## 2022-01-19 DIAGNOSIS — Z6827 Body mass index (BMI) 27.0-27.9, adult: Secondary | ICD-10-CM | POA: Diagnosis not present

## 2022-01-19 DIAGNOSIS — F5104 Psychophysiologic insomnia: Secondary | ICD-10-CM | POA: Diagnosis not present

## 2022-01-19 DIAGNOSIS — R69 Illness, unspecified: Secondary | ICD-10-CM | POA: Diagnosis not present

## 2022-01-19 DIAGNOSIS — F0390 Unspecified dementia without behavioral disturbance: Secondary | ICD-10-CM | POA: Diagnosis not present

## 2022-01-19 DIAGNOSIS — I495 Sick sinus syndrome: Secondary | ICD-10-CM | POA: Diagnosis not present

## 2022-01-19 DIAGNOSIS — R2681 Unsteadiness on feet: Secondary | ICD-10-CM | POA: Diagnosis not present

## 2022-01-19 DIAGNOSIS — E039 Hypothyroidism, unspecified: Secondary | ICD-10-CM | POA: Diagnosis not present

## 2022-02-28 ENCOUNTER — Ambulatory Visit: Payer: Medicare HMO | Admitting: Neurology

## 2022-02-28 ENCOUNTER — Encounter: Payer: Self-pay | Admitting: Neurology

## 2022-04-18 DIAGNOSIS — E039 Hypothyroidism, unspecified: Secondary | ICD-10-CM | POA: Diagnosis not present

## 2022-04-18 DIAGNOSIS — E538 Deficiency of other specified B group vitamins: Secondary | ICD-10-CM | POA: Diagnosis not present

## 2022-04-18 DIAGNOSIS — R030 Elevated blood-pressure reading, without diagnosis of hypertension: Secondary | ICD-10-CM | POA: Diagnosis not present

## 2022-04-18 DIAGNOSIS — E782 Mixed hyperlipidemia: Secondary | ICD-10-CM | POA: Diagnosis not present

## 2022-04-18 DIAGNOSIS — E7849 Other hyperlipidemia: Secondary | ICD-10-CM | POA: Diagnosis not present

## 2022-04-18 DIAGNOSIS — K219 Gastro-esophageal reflux disease without esophagitis: Secondary | ICD-10-CM | POA: Diagnosis not present

## 2022-04-18 DIAGNOSIS — I10 Essential (primary) hypertension: Secondary | ICD-10-CM | POA: Diagnosis not present

## 2022-04-18 DIAGNOSIS — E559 Vitamin D deficiency, unspecified: Secondary | ICD-10-CM | POA: Diagnosis not present

## 2022-04-25 DIAGNOSIS — E7849 Other hyperlipidemia: Secondary | ICD-10-CM | POA: Diagnosis not present

## 2022-04-25 DIAGNOSIS — R2681 Unsteadiness on feet: Secondary | ICD-10-CM | POA: Diagnosis not present

## 2022-04-25 DIAGNOSIS — Z6827 Body mass index (BMI) 27.0-27.9, adult: Secondary | ICD-10-CM | POA: Diagnosis not present

## 2022-04-25 DIAGNOSIS — F0390 Unspecified dementia without behavioral disturbance: Secondary | ICD-10-CM | POA: Diagnosis not present

## 2022-04-25 DIAGNOSIS — I495 Sick sinus syndrome: Secondary | ICD-10-CM | POA: Diagnosis not present

## 2022-04-25 DIAGNOSIS — E039 Hypothyroidism, unspecified: Secondary | ICD-10-CM | POA: Diagnosis not present

## 2022-04-25 DIAGNOSIS — R69 Illness, unspecified: Secondary | ICD-10-CM | POA: Diagnosis not present

## 2022-04-25 DIAGNOSIS — F5104 Psychophysiologic insomnia: Secondary | ICD-10-CM | POA: Diagnosis not present

## 2022-04-25 DIAGNOSIS — Z23 Encounter for immunization: Secondary | ICD-10-CM | POA: Diagnosis not present

## 2022-04-25 DIAGNOSIS — R030 Elevated blood-pressure reading, without diagnosis of hypertension: Secondary | ICD-10-CM | POA: Diagnosis not present

## 2022-06-21 ENCOUNTER — Ambulatory Visit: Payer: Medicare HMO | Admitting: Neurology

## 2022-06-21 ENCOUNTER — Encounter: Payer: Self-pay | Admitting: Neurology

## 2022-06-21 VITALS — BP 139/89 | HR 57 | Ht 66.0 in | Wt 162.8 lb

## 2022-06-21 DIAGNOSIS — E079 Disorder of thyroid, unspecified: Secondary | ICD-10-CM | POA: Diagnosis not present

## 2022-06-21 DIAGNOSIS — R69 Illness, unspecified: Secondary | ICD-10-CM | POA: Diagnosis not present

## 2022-06-21 DIAGNOSIS — R001 Bradycardia, unspecified: Secondary | ICD-10-CM | POA: Diagnosis not present

## 2022-06-21 DIAGNOSIS — R413 Other amnesia: Secondary | ICD-10-CM | POA: Diagnosis not present

## 2022-06-21 DIAGNOSIS — E559 Vitamin D deficiency, unspecified: Secondary | ICD-10-CM | POA: Diagnosis not present

## 2022-06-21 DIAGNOSIS — Z131 Encounter for screening for diabetes mellitus: Secondary | ICD-10-CM | POA: Diagnosis not present

## 2022-06-21 DIAGNOSIS — Z818 Family history of other mental and behavioral disorders: Secondary | ICD-10-CM | POA: Diagnosis not present

## 2022-06-21 DIAGNOSIS — E538 Deficiency of other specified B group vitamins: Secondary | ICD-10-CM | POA: Diagnosis not present

## 2022-06-21 DIAGNOSIS — G479 Sleep disorder, unspecified: Secondary | ICD-10-CM | POA: Diagnosis not present

## 2022-06-21 NOTE — Progress Notes (Signed)
Subjective:    Patient ID: [name] [name] is a 76 y.o. female.  HPI    [name], MD, PhD [facility] [address] [place], [place] [address]  Dear Dr. Pleas Koch,   I saw your patient, [name] [name], upon your kind request in my neurologic clinic today for initial consultation of her memory loss.  The patient is accompanied by her daughter, [name] [name] today. Of note, she missed an appointment on 02/28/2022. As you know, [name] [name] is a 76 year old right-handed woman with an underlying medical history of thyroid disease, reflux disease, low back pain, bradycardia, anxiety, remote history of syncope, vitamin D deficiency, and overweight state, who reports forgetfulness and short-term memory issues for the past 1 or 2 years.  Her daughter believes that she has had progressive memory loss for about 2 years.  She had been advised to pursue sleep testing in 2022 but did not have any sleep study.  I reviewed your office note from 09/26/2021.  She was started on donepezil in January 2023, this was discontinued due to side effects in February 2023.  She had a follow-up appointment with your office on 11/29/2021, as well as 01/19/2022, at which time her daughter reported that her memory had worsened.  She was noted to have a shuffling gait as well.    I reviewed blood test results from 12/07/2021.  CMP was unremarkable, BUN was 14, creatinine 0.89, alk phos 78, AST 18, ALT 12, TSH normal at 3.11, CBC with differential unremarkable with hemoglobin of 14.1, hematocrit 43.4, platelets 248.  We will request her most recent blood test results from your office.  I reviewed your office note from 04/25/2022, she also had blood work on 04/18/2022 and I was able to review the results.  Vitamin B12 twice borderline level at 242, vitamin D was low at 23.2, TSH elevated at 6.05, total cholesterol mildly elevated at 218, LDL elevated at 129.4.  She had a remote brain MRI without contrast and MR  angiogram of the head without contrast for indication of syncopal event with reported head injury on 06/04/2015 and I reviewed the results: IMPRESSION: No acute intracranial findings.   Extensive Feria matter disease, favored to represent chronic microvascular ischemic change although demyelinating process not completely excluded.   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   She had a head CT without contrast for indication of frontal headache on 03/21/2016 and I reviewed the results: IMPRESSION: No CT evidence of acute intracranial abnormality.  She reports that her father had memory loss or some form of dementia.  He lived to be 22 years old, mom lived to be 34.  The patient had a total of 2 sisters and 1 brother, 1 sister passed away, she currently has a brother age 32 and a sister age 9.  Neither one of her siblings had memory loss.  She lives with her husband, she lost her first husband at age 37 from a massive heart attack.  She has 2 children from her first husband, [name] [name] is the oldest and she has a son.  Patient drives, typically familiar routes only, no issues driving but per [name] [name] she has had some difficulty finding her way back home at times.  She is no longer on Aricept, she had worsening bradycardia, per [name] [name], she may need a pacemaker at some point.  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  She is a non-smoker and does not currently drink any alcohol, she drinks caffeine in the form of tea, 1 serving per day and occasional soda, she hardly drinks any water.  She does not sleep well, she has chronic difficulty going to sleep.  She is not sure if she snores.  Her husband sleeps in a separate bedroom  typically.   Her Past Medical History Is Significant For: Past Medical History:  Diagnosis Date   Anxiety    GERD (gastroesophageal reflux disease)    Thyroid disease     Her Past Surgical History Is Significant For: Past Surgical History:  Procedure Laterality Date   ABDOMINAL HYSTERECTOMY     BLADDER REPAIR     COLONOSCOPY N/A 04/29/2018   Procedure: COLONOSCOPY;  Surgeon: Rogene Houston, MD;  [facility];  Service: Endoscopy;  Laterality: N/A;  2:35   DILATION AND CURETTAGE OF UTERUS     times 4   I & D EXTREMITY  06/28/2012   Procedure: MINOR IRRIGATION AND DEBRIDEMENT EXTREMITY;  Surgeon: Cammie Sickle., MD;  Location: [facility];  Service: Orthopedics;  Laterality: Right;  Debride Mucoid Cyst, Debride Joint    POLYPECTOMY  04/29/2018   Procedure: POLYPECTOMY INTESTINAL;  Surgeon: Rogene Houston, MD;  [facility];  Service: Endoscopy;;    Her Family History Is Significant For: Family History  Problem Relation Age of Onset   Cancer Father    Alzheimer's disease Father     Her Social History Is Significant For: Social History   Socioeconomic History   Marital status: Married    Spouse name: Not on file   Number of children: Not on file   Years of education: Not on file   Highest education level: Not on file  Occupational History   Not on file  Tobacco Use   Smoking status: Never   Smokeless tobacco: Never  Substance and Sexual Activity   Alcohol use: No    Alcohol/week: 0.0 standard drinks of alcohol   Drug use: No   Sexual activity: Not on file  Other Topics Concern   Not on file  Social History Narrative   Not on file   Social Determinants of Health   Financial Resource Strain: Not on file  Food Insecurity: Not on file  Transportation Needs: Not on file  Physical Activity: Not on file  Stress: Not on file  Social Connections: Not on file    Her Allergies Are:  Allergies  Allergen Reactions    Prednisone Other (See Comments)    Rectal bleeding Other reaction(s): Other (See Comments) Rectal bleeding   Amoxicillin-Pot Clavulanate Hives and Rash   Ciprofloxacin Rash  :   Her Current Medications Are:  Outpatient Encounter Medications as of 06/21/2022  Medication Sig   atorvastatin (LIPITOR) 10 MG tablet Take 10 mg by mouth daily.   Cholecalciferol (VITAMIN D-3 PO) Take by mouth.   levothyroxine (SYNTHROID) 50 MCG tablet Take 50 mcg by mouth every other day.   levothyroxine (SYNTHROID) 75 MCG tablet Take 75 mcg by mouth every other day.   omeprazole (PRILOSEC) 20 MG capsule Take 1 capsule (20 mg total) by mouth daily.   acetaminophen (TYLENOL 8 HOUR) 650 MG CR tablet Take 1 tablet (650 mg total) by mouth every 8 (eight)  hours.   ibuprofen (ADVIL,MOTRIN) 200 MG tablet Take 200 mg by mouth every 6 (six) hours as needed (pain).   meloxicam (MOBIC) 15 MG tablet Take 15 mg by mouth daily.   methocarbamol (ROBAXIN) 500 MG tablet Take 1 tablet (500 mg total) by mouth every 6 (six) hours as needed for muscle spasms.   ondansetron (ZOFRAN-ODT) 4 MG disintegrating tablet Take by mouth.   traMADol (ULTRAM) 50 MG tablet Take by mouth.   valACYclovir (VALTREX) 1000 MG tablet Take 1 tablet (1,000 mg total) by mouth 3 (three) times daily.   No facility-administered encounter medications on file as of 06/21/2022.  :   Review of Systems:  Out of a complete 14 point review of systems, all are reviewed and negative with the exception of these symptoms as listed below:  Review of Systems  Neurological:        Pt here for Memory loss  Daughter states pt short term memory is worse Pt forgets names,places, and  gets lost on the way home at times  Daughter states pt forgets to pay bills and forgets how to cook certain items she has cooked for years.     Objective:  Neurological Exam  Physical Exam Physical Examination:   Vitals:   06/21/22 1431  BP: 139/89  [name]: (!) 57    General  Examination: The patient is a very pleasant 76 y.o. female in no acute distress. She appears well-developed and well-nourished and well groomed.   HEENT: Normocephalic, atraumatic, pupils are equal, round and reactive to light, extraocular tracking is good without limitation to gaze excursion or nystagmus noted. Hearing is grossly intact. Face is symmetric with normal facial animation. Speech is clear with no dysarthria noted. There is no hypophonia. There is a mild intermittent side-to-side head tremor and lower jaw tremor.  Neck is supple with full range of passive and active motion. There are no carotid bruits on auscultation. Oropharynx exam reveals: moderate mouth dryness, adequate dental hygiene. Tongue protrudes centrally and palate elevates symmetrically.   Chest: Clear to auscultation without wheezing, rhonchi or crackles noted.  Heart: S1+S2+0, regular and normal without murmurs, rubs or gallops noted.   Abdomen: Soft, non-tender and non-distended.  Extremities: There is no pitting edema in the distal lower extremities bilaterally.   Skin: Warm and dry without trophic changes noted.   Musculoskeletal: exam reveals no obvious joint deformities.   Neurologically:  Mental status: The patient is awake, paying good attention, able to provide her history but details are provided by her daughter.       06/21/2022    2:34 PM  MMSE - Mini Mental State Exam  Orientation to time 5  Orientation to Place 5  Registration 3  Attention/ Calculation 3  Recall 3  Language- name 2 objects 2  Language- repeat 1  Language- follow 3 step command 3  Language- read & follow direction 0  Write a sentence 1  Copy design 1  Total score 27    On 06/21/2022: CDT: 4/4, AFT: 4/min.  Thought process is linear. Mood is normal and affect is normal.  Cranial nerves II - XII are as described above under HEENT exam.  Motor exam: Normal bulk, strength and tone is noted. There is no obvious action or  resting tremor.  Reflexes are 1+. Fine motor skills and coordination: grossly intact.  Cerebellar testing: No dysmetria or intention tremor. There is no truncal or gait ataxia.  Sensory exam: intact to light touch in the upper  and lower extremities.  Gait, station and balance: She stands without difficulty, posture is age-appropriate to mildly stooped in the upper back.  She walks without a walking aid, no shuffling but slowly and cautiously, mild decrease in arm swing bilaterally.   Assessment and Plan:   In summary, [name] [name] is a very pleasant 76 y.o.-year old female with an underlying medical history of thyroid disease, reflux disease, low back pain, bradycardia, anxiety, remote history of syncope, vitamin D deficiency, and overweight state, who presents for evaluation of her memory loss of approximately 2 years duration with progression noted.  History and examination are no telltale for parkinsonism.  She has a family history of dementia and would be at risk for Alzheimer's dementia, she also has vascular risk factors.  She had a recent blood test that showed borderline low vitamin B12, she also had an abnormal TSH.  We talked about the importance of medical management.  We will go ahead and proceed with some blood work today to repeat B12, vitamin D, TSH and add additional inflammatory and autoimmune markers.  We will call them with the test results.  She had side effects from donepezil, particularly worsening bradycardia.  I would not like to try another medication in the same family such as Exelon or galantamine.  We may consider Namenda in the near future but we will go ahead and proceed with a brain MRI for now as well as blood testing and keep him posted as to the results by phone call.  We talked about the importance of maintaining a healthy lifestyle including good nutrition, good hydration with water, in particular, she was advised to increase her water intake to about 6 to 8 cups of water  per day on average, 8 ounce size each.  We talked about the importance of physical activity and exercise on a regular basis.  For now, we will plan a follow-up after testing.  We may consider a neuropsychological evaluation for further in-depth evaluation if the need arises.  I answered all the questions today and the patient and her daughter, [name] [name], reviewed agreement.  Thank you very much for allowing me to participate in the care of this nice patient. If I can be of any further assistance to you please do not hesitate to call me at [phone].  Sincerely,   [name], MD, PhD

## 2022-06-21 NOTE — Patient Instructions (Signed)
It was nice to meet you and your daughter Juliann Pulse today.   You have complaints of memory loss: memory loss or changes in cognitive function can have many reasons and does not always mean you have dementia. Conditions that can contribute to subjective or objective memory loss include: depression, stress, poor sleep from insomnia or sleep apnea, dehydration, fluctuation in blood sugar values, thyroid or electrolyte dysfunction and certain vitamin deficiencies. Dementia can be caused by stroke, brain atherosclerosis or brain vascular disease due to vascular risk factors (smoking, high blood pressure, high cholesterol, obesity and uncontrolled diabetes), certain degenerative brain disorders (including Parkinson's disease and Multiple sclerosis) and by Alzheimer's disease or other, more rare and sometimes hereditary causes.   We will do some additional testing: blood work (which we will do today) and we will do a brain scan, called MRI.   We will not start medication as yet.  We may also request a formal cognitive test called neuropsychological evaluation which is done by a licensed neuropsychologist. We will make a referral in that regard if need be.   We will call you with brain scan test results and blood test results and plan to follow-up in about 3 months.    Please try to hydrate better with water, 6 to 8 cups of water per day are generally recommended, 8 ounce size each.

## 2022-06-29 LAB — COMPREHENSIVE METABOLIC PANEL
ALT: 10 IU/L (ref 0–32)
AST: 17 IU/L (ref 0–40)
Albumin/Globulin Ratio: 1.7 (ref 1.2–2.2)
Albumin: 4.2 g/dL (ref 3.8–4.8)
Alkaline Phosphatase: 74 IU/L (ref 44–121)
BUN/Creatinine Ratio: 20 (ref 12–28)
BUN: 16 mg/dL (ref 8–27)
Bilirubin Total: 0.4 mg/dL (ref 0.0–1.2)
CO2: 23 mmol/L (ref 20–29)
Calcium: 9.4 mg/dL (ref 8.7–10.3)
Chloride: 109 mmol/L — ABNORMAL HIGH (ref 96–106)
Creatinine, Ser: 0.8 mg/dL (ref 0.57–1.00)
Globulin, Total: 2.5 g/dL (ref 1.5–4.5)
Glucose: 90 mg/dL (ref 70–99)
Potassium: 4.2 mmol/L (ref 3.5–5.2)
Sodium: 144 mmol/L (ref 134–144)
Total Protein: 6.7 g/dL (ref 6.0–8.5)
eGFR: 76 mL/min/{1.73_m2} (ref >59)

## 2022-06-29 LAB — VITAMIN D 25 HYDROXY (VIT D DEFICIENCY, FRACTURES): Vit D, 25-Hydroxy: 30.9 ng/mL (ref 30.0–100.0)

## 2022-06-29 LAB — TSH: TSH: 1.66 u[IU]/mL (ref 0.450–4.500)

## 2022-06-29 LAB — RHEUMATOID FACTOR: Rheumatoid fact SerPl-aCnc: <10 IU/mL (ref <14.0)

## 2022-06-29 LAB — C-REACTIVE PROTEIN: CRP: <1 mg/L (ref 0–10)

## 2022-06-29 LAB — VITAMIN B1: Thiamine: 91.5 nmol/L (ref 66.5–200.0)

## 2022-06-29 LAB — ANA W/REFLEX: Anti Nuclear Antibody (ANA): NEGATIVE

## 2022-06-29 LAB — HGB A1C W/O EAG: Hgb A1c MFr Bld: 5.6 % (ref 4.8–5.6)

## 2022-06-29 LAB — RPR: RPR Ser Ql: NONREACTIVE

## 2022-06-29 LAB — SEDIMENTATION RATE: Sed Rate: 6 mm/hr (ref 0–40)

## 2022-06-29 LAB — VITAMIN B6: Vitamin B6: 2.9 ug/L — ABNORMAL LOW (ref 3.4–65.2)

## 2022-07-03 ENCOUNTER — Telehealth: Payer: Self-pay

## 2022-07-03 NOTE — Telephone Encounter (Signed)
-----   Message from [name], MD sent at 06/29/2022  5:02 PM EST ----- Vitamin B6 was below normal, please advise patient to add an over-the-counter vitamin B6 supplement for the next 2 to 3 months and she can follow the instructions on the bottle.  She can have her vitamin B6 level rechecked by primary care in about 3 months.

## 2022-07-03 NOTE — Telephone Encounter (Signed)
I called patient to discuss. No answer, left a message asking her to call us back. If patient calls back another day please route to POD 4. ?

## 2022-07-04 NOTE — Telephone Encounter (Signed)
[name], MD 06/26/2022  7:45 AM EST     Please call patient and advise her that her labs showed a borderline low vitamin D.  It would probably be beneficial if she start over-the-counter vitamin D supplementation, about 1000 units or 500 units daily for now.  I would recommend that she have her vitamin D level rechecked by her primary care in the next 2 to 3 months after starting the supplement.  2 of the tests are pending, namely vitamin B6 and B1, we will update if abnormal.  Everything else was benign.

## 2022-07-04 NOTE — Telephone Encounter (Signed)
I spoke with the patient and we discussed her lab results.  Patient is aware that her vitamin D level was borderline low.  Dr. Rexene Alberts states it would probably be beneficial if patient starts an over-the-counter vitamin D supplementation, about 1000 units or 500 units daily for now.  Also advised her that patient's vitamin B6 level was below normal and Dr. Rexene Alberts also recommends that she start taking an over-the-counter vitamin B6 supplement for the next 2 to 3 months and she can follow the instructions on the bottle.  Dr. Rexene Alberts recommends that she have both of these levels rechecked by primary care in about 3 months. Patient verbalized understanding, and her questions were answered.  Is a she confirmed she still sees Dr. Pleas Koch and I let her know I would send the results to him.  She verbalized appreciation for the call.  Lab results forwarded to Dr Judd Lien. Since the patient's daughter was with her at the office visit and she was seen for some memory difficulty, I called the patient's daughter and left voicemail for call back just so we can go over those results as well with her.

## 2022-07-07 ENCOUNTER — Telehealth: Payer: Self-pay | Admitting: Neurology

## 2022-07-07 NOTE — Telephone Encounter (Signed)
[employer] Josem Kaufmann: [id number] exp. 07/07/22-01/03/23 for [facility] [phone]

## 2022-08-17 ENCOUNTER — Ambulatory Visit (HOSPITAL_COMMUNITY)
Admission: RE | Admit: 2022-08-17 | Discharge: 2022-08-17 | Disposition: A | Discharge status: Home / Self Care | Payer: Medicare HMO | Source: Ambulatory Visit | Attending: Neurology | Admitting: Neurology

## 2022-08-17 DIAGNOSIS — E079 Disorder of thyroid, unspecified: Secondary | ICD-10-CM

## 2022-08-17 DIAGNOSIS — R413 Other amnesia: Secondary | ICD-10-CM

## 2022-08-17 DIAGNOSIS — E538 Deficiency of other specified B group vitamins: Secondary | ICD-10-CM | POA: Diagnosis not present

## 2022-08-17 DIAGNOSIS — G479 Sleep disorder, unspecified: Secondary | ICD-10-CM | POA: Diagnosis not present

## 2022-08-17 DIAGNOSIS — E559 Vitamin D deficiency, unspecified: Secondary | ICD-10-CM

## 2022-08-17 DIAGNOSIS — Z818 Family history of other mental and behavioral disorders: Secondary | ICD-10-CM | POA: Diagnosis present

## 2022-08-17 DIAGNOSIS — R001 Bradycardia, unspecified: Secondary | ICD-10-CM | POA: Diagnosis not present

## 2022-08-17 DIAGNOSIS — R69 Illness, unspecified: Secondary | ICD-10-CM | POA: Diagnosis not present

## 2022-08-17 MED ORDER — GADOBUTROL 1 MMOL/ML IV SOLN
7.5000 mL | Freq: Once | INTRAVENOUS | Status: AC | PRN
  Administered 2022-08-17: 7.5 mL via INTRAVENOUS

## 2022-09-07 ENCOUNTER — Telehealth: Payer: Self-pay | Admitting: *Deleted

## 2022-09-07 NOTE — Telephone Encounter (Signed)
Attempted to reach pt about her MRI results. This is our office's second attempt. LVM with office number and hours asking for call back. I also sent a mychart message to the patient.

## 2022-09-07 NOTE — Telephone Encounter (Signed)
-----  Message from [name], MD sent at 08/21/2022  7:27 AM EST ----- Please call patient regarding the recent brain MRI: The brain scan showed a normal structure of the brain and age-appropriate volume loss which we call atrophy. There were changes in the deeper structures of the brain, which we call Hinesley matter changes or microvascular changes. These were reported as moderate in Her case. These are tiny Whelpley spots, that occur with time and are seen in a variety of conditions, including with normal aging, chronic hypertension, chronic headaches, especially migraine HAs, chronic diabetes, chronic hyperlipidemia. These are not strokes and no mass or lesion or contrast enhancement was seen which is reassuring. Again, there were no acute findings, such as a stroke, or mass or blood products. No further action is required on this test at this time, other than re-enforcing the importance of good blood pressure control, good cholesterol control, good blood sugar control, and weight management. Please remind patient to keep any upcoming appointments or tests and to call us with any interim questions, concerns, problems or updates. Thanks,  [name], MD, PhD

## 2022-09-12 NOTE — Telephone Encounter (Signed)
Tried to reach pt again. LVM asking for call back. I called pt's daughter Juliann Pulse (on [place]) and discussed the pt's MRI brain results and Dr Guadelupe Sabin recommendations as noted below by Dr Rexene Alberts. She verbalized understanding and her questions were answered. Report sent to Dr Pleas Koch which she was appreciative of. She is aware her mother's appt for f/u with Dr Rexene Alberts is on 10/17/22 at 115 pm arrival 1245. She verbalized appreciation for the call.

## 2022-09-14 NOTE — Progress Notes (Signed)
Additional ICD 10 code for A1C screening added for [facility] for insurance  Z13.1.Marland Kitchen

## 2022-10-03 DIAGNOSIS — E039 Hypothyroidism, unspecified: Secondary | ICD-10-CM | POA: Diagnosis not present

## 2022-10-03 DIAGNOSIS — E7849 Other hyperlipidemia: Secondary | ICD-10-CM | POA: Diagnosis not present

## 2022-10-03 DIAGNOSIS — E782 Mixed hyperlipidemia: Secondary | ICD-10-CM | POA: Diagnosis not present

## 2022-10-03 DIAGNOSIS — I10 Essential (primary) hypertension: Secondary | ICD-10-CM | POA: Diagnosis not present

## 2022-10-03 DIAGNOSIS — K219 Gastro-esophageal reflux disease without esophagitis: Secondary | ICD-10-CM | POA: Diagnosis not present

## 2022-10-04 ENCOUNTER — Ambulatory Visit: Payer: Medicare HMO | Admitting: Neurology

## 2022-10-10 DIAGNOSIS — R2681 Unsteadiness on feet: Secondary | ICD-10-CM | POA: Diagnosis not present

## 2022-10-10 DIAGNOSIS — E039 Hypothyroidism, unspecified: Secondary | ICD-10-CM | POA: Diagnosis not present

## 2022-10-10 DIAGNOSIS — I10 Essential (primary) hypertension: Secondary | ICD-10-CM | POA: Diagnosis not present

## 2022-10-10 DIAGNOSIS — F5104 Psychophysiologic insomnia: Secondary | ICD-10-CM | POA: Diagnosis not present

## 2022-10-10 DIAGNOSIS — F0390 Unspecified dementia without behavioral disturbance: Secondary | ICD-10-CM | POA: Diagnosis not present

## 2022-10-10 DIAGNOSIS — Z6827 Body mass index (BMI) 27.0-27.9, adult: Secondary | ICD-10-CM | POA: Diagnosis not present

## 2022-10-10 DIAGNOSIS — I495 Sick sinus syndrome: Secondary | ICD-10-CM | POA: Diagnosis not present

## 2022-10-10 DIAGNOSIS — Z23 Encounter for immunization: Secondary | ICD-10-CM | POA: Diagnosis not present

## 2022-10-10 DIAGNOSIS — E7849 Other hyperlipidemia: Secondary | ICD-10-CM | POA: Diagnosis not present

## 2022-10-10 DIAGNOSIS — Z0001 Encounter for general adult medical examination with abnormal findings: Secondary | ICD-10-CM | POA: Diagnosis not present

## 2022-10-10 DIAGNOSIS — R69 Illness, unspecified: Secondary | ICD-10-CM | POA: Diagnosis not present

## 2022-10-17 ENCOUNTER — Encounter: Payer: Self-pay | Admitting: Neurology

## 2022-10-17 ENCOUNTER — Ambulatory Visit: Payer: Medicare HMO | Admitting: Neurology

## 2022-10-17 VITALS — BP 110/79 | HR 59 | Ht 66.0 in | Wt 162.4 lb

## 2022-10-17 DIAGNOSIS — R413 Other amnesia: Secondary | ICD-10-CM

## 2022-10-17 DIAGNOSIS — R001 Bradycardia, unspecified: Secondary | ICD-10-CM

## 2022-10-17 MED ORDER — MEMANTINE HCL 5 MG PO TABS: 5.0000 mg | ORAL_TABLET | Freq: Two times a day (BID) | ORAL | 5 refills | Status: DC

## 2022-10-17 NOTE — Patient Instructions (Signed)
It was nice to see you both again today.  I would like for you to drink more water, about 3-4 bottles per day, 16.9 oz size each.  As discussed, we will start you for your memory on Namenda (generic name: Memantine), starting at 5 mg strength with gradual buildup to 10 mg twice daily.   For now, take 1 pill each evening for 2 weeks, then increase to twice daily thereafter.  Please note that side effects may include, but are not limited to: nausea, confusion, hallucination, personality changes. If you are having mild side effects, try to stick with the treatment as these initial side effects may go away after the first 10-14 days.

## 2022-10-17 NOTE — Progress Notes (Signed)
Subjective:    Patient ID: [name] [name] is a 77 y.o. female.  HPI    Interim history:   [name] [name] is a 77 year old right-handed woman with an underlying medical history of thyroid disease, reflux disease, low back pain, bradycardia, anxiety, remote history of syncope, vitamin D deficiency, and overweight state, who presents for follow-up consultation of her memory loss. The patient is accompanied by her daughter today.  I first met her at the request of her primary care physician on 06/21/2022, at which time she reported a 2-year history of progressive memory loss.  Her MMSE was 27 out of 30 at the time.  She was advised to proceed with further evaluation with a brain MRI and labs.  Laboratory testing showed a borderline low vitamin D level and mildly below normal vitamin B6 level.  She was advised to start vitamin D and B6 supplementation and get her levels rechecked by her primary care in about 3 months.  She had a brain MRI with and without contrast through [facility] on 08/17/2022 and I reviewed the results:  IMPRESSION: 1. No specific or reversible cause for memory loss. Age congruent brain volume. 2. 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   Her daughter was notified via phone call.  Today, 10/17/2022: She reports feeling okay, no recent new problem. Daughter reports that since she has taken over medication administration and getting her pillbox ready, patient seems to be doing a little better.  She has had no recent falls, still does not always drink a lot of water.  She does have bradycardia and per cardiologist should not take any medications that lowers heart rate.  She has been taking vitamin D and vitamin B12 but not vitamin B6.  She had a recent checkup with primary care.   The patient's allergies, current medications, family history, past medical history, past social history, past surgical history and problem list were reviewed and updated as  appropriate.   Previously:   06/21/22: (She) reports forgetfulness and short-term memory issues for the past 1 or 2 years.  Her daughter believes that she has had progressive memory loss for about 2 years.  She had been advised to pursue sleep testing in 2022 but did not have any sleep study.  I reviewed your office note from 09/26/2021.  She was started on donepezil in January 2023, this was discontinued due to side effects in February 2023.  She had a follow-up appointment with your office on 11/29/2021, as well as 01/19/2022, at which time her daughter reported that her memory had worsened.  She was noted to have a shuffling gait as well.     I reviewed blood test results from 12/07/2021.  CMP was unremarkable, BUN was 14, creatinine 0.89, alk phos 78, AST 18, ALT 12, TSH normal at 3.11, CBC with differential unremarkable with hemoglobin of 14.1, hematocrit 43.4, platelets 248.  We will request her most recent blood test results from your office.   I reviewed your office note from 04/25/2022, she also had blood work on 04/18/2022 and I was able to review the results.  Vitamin B12 twice borderline level at 242, vitamin D was low at 23.2, TSH elevated at 6.05, total cholesterol mildly elevated at 218, LDL elevated at 129.4.   She had a remote brain MRI without contrast and MR angiogram of the head without contrast for indication of syncopal event with reported head injury on 06/04/2015 and I reviewed the results: IMPRESSION:  No acute intracranial findings.   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   She had a head CT without contrast for indication of frontal headache on 03/21/2016 and I reviewed the results: IMPRESSION: No CT evidence of acute  intracranial abnormality.   She reports that her father had memory loss or some form of dementia.  He lived to be 33 years old, mom lived to be 28.  The patient had a total of 2 sisters and 1 brother, 1 sister passed away, she currently has a brother age 79 and a sister age 11.  Neither one of her siblings had memory loss.  She lives with her husband, she lost her first husband at age 31 from a massive heart attack.  She has 2 children from her first husband, Juliann Pulse is the oldest and she has a son.  Patient drives, typically familiar routes only, no issues driving but per Juliann Pulse she has had some difficulty finding her way back home at times.  She is no longer on Aricept, she had worsening bradycardia, per Juliann Pulse, she may need a pacemaker at some point.  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  She is a non-smoker and does not currently drink any alcohol, she drinks caffeine in the form of tea, 1 serving per day and occasional soda, she hardly drinks any water.  She does not sleep well, she has chronic difficulty going to sleep.  She is not sure if she snores.  Her husband sleeps in a separate bedroom typically.     Her Past Medical History Is Significant For: Past Medical History:  Diagnosis Date   Anxiety    GERD (gastroesophageal reflux disease)    Thyroid disease     Her Past Surgical History Is Significant For: Past Surgical History:  Procedure Laterality Date   ABDOMINAL HYSTERECTOMY     BLADDER REPAIR     COLONOSCOPY N/A 04/29/2018   Procedure: COLONOSCOPY;  Surgeon: Rogene Houston, MD;  Location: [facility];  Service: Endoscopy;  Laterality: N/A;  2:35   DILATION AND CURETTAGE OF UTERUS     times 4   I & D EXTREMITY  06/28/2012   Procedure: MINOR IRRIGATION AND DEBRIDEMENT EXTREMITY;  Surgeon: Cammie Sickle., MD;  Location: [facility];  Service: Orthopedics;  Laterality: Right;  Debride Mucoid Cyst, Debride Joint    POLYPECTOMY  04/29/2018   Procedure: POLYPECTOMY INTESTINAL;  Surgeon: Rogene Houston, MD;  Location: [facility];  Service: Endoscopy;;    Her Family History Is Significant For: Family History  Problem Relation Age of Onset   Cancer Father    Alzheimer's disease Father     Her Social History Is Significant For: Social History   Socioeconomic History   Marital status: Married    Spouse name: Not on file   Number of children: Not on file   Years of education: Not on file   Highest education level: Not on file  Occupational History   Not on file  Tobacco Use   Smoking status: Never   Smokeless tobacco: Never  Substance and Sexual Activity  Alcohol use: No    Alcohol/week: 0.0 standard drinks of alcohol   Drug use: No   Sexual activity: Not on file  Other Topics Concern   Not on file  Social History Narrative   Not on file   Social Determinants of Health   Financial Resource Strain: Not on file  Food Insecurity: Not on file  Transportation Needs: Not on file  Physical Activity: Not on file  Stress: Not on file  Social Connections: Not on file    Her Allergies Are:  Allergies  Allergen Reactions   Prednisone Other (See Comments)    Rectal bleeding Other reaction(s): Other (See Comments) Rectal bleeding   Amoxicillin-Pot Clavulanate Hives and Rash   Ciprofloxacin Rash  :   Her Current Medications Are:  Outpatient Encounter Medications as of 10/17/2022  Medication Sig   atorvastatin (LIPITOR) 10 MG tablet Take 10 mg by mouth daily.   Cholecalciferol (VITAMIN D-3 PO) Take by mouth.   Cyanocobalamin (B-12 PO) Take by mouth.   ibuprofen (ADVIL,MOTRIN) 200 MG tablet Take 200 mg by mouth as needed (pain).   levothyroxine (SYNTHROID) 50 MCG tablet Take 50 mcg by mouth every other day.   levothyroxine (SYNTHROID) 75 MCG tablet  Take 75 mcg by mouth every other day.   omeprazole (PRILOSEC) 20 MG capsule Take 1 capsule (20 mg total) by mouth daily.   acetaminophen (TYLENOL 8 HOUR) 650 MG CR tablet Take 1 tablet (650 mg total) by mouth every 8 (eight) hours.   meloxicam (MOBIC) 15 MG tablet Take 15 mg by mouth daily.   methocarbamol (ROBAXIN) 500 MG tablet Take 1 tablet (500 mg total) by mouth every 6 (six) hours as needed for muscle spasms.   ondansetron (ZOFRAN-ODT) 4 MG disintegrating tablet Take by mouth.   traMADol (ULTRAM) 50 MG tablet Take by mouth.   valACYclovir (VALTREX) 1000 MG tablet Take 1 tablet (1,000 mg total) by mouth 3 (three) times daily.   No facility-administered encounter medications on file as of 10/17/2022.  :  Review of Systems:  Out of a complete 14 point review of systems, all are reviewed and negative with the exception of these symptoms as listed below:  Review of Systems  Neurological:        Pt here for Memory f/u  Daughter states 2 weeks ago patient went to MD office and forgot why she was there and her name and DOB Daughter states pt has no change since last visit     Objective:  Neurological Exam  Physical Exam Physical Examination:   Vitals:   10/17/22 1321  BP: 110/79  Pulse: (!) 59    General Examination: The patient is a very pleasant 77 y.o. female in no acute distress. She appears well-developed and well-nourished and well groomed.   HEENT: Normocephalic, atraumatic, pupils are equal, round and reactive to light, extraocular tracking is good without limitation to gaze excursion or nystagmus noted. Hearing is grossly intact. Face is symmetric with normal facial animation. Speech is clear without dysarthria, hypophonia or voice tremor.  There is a stable and mild intermittent side-to-side head tremor and lower jaw tremor.  Neck is supple with full range of passive and active motion. There are no carotid bruits on auscultation. Oropharynx exam reveals: moderate mouth  dryness, adequate dental hygiene. Tongue protrudes centrally and palate elevates symmetrically.    Chest: Clear to auscultation without wheezing, rhonchi or crackles noted.   Heart: S1+S2+0, regular and normal without murmurs, rubs or gallops noted.  Abdomen: Soft, non-tender and non-distended.   Extremities: There is no pitting edema in the distal lower extremities bilaterally.    Skin: Warm and dry without trophic changes noted.    Musculoskeletal: exam reveals no obvious joint deformities.    Neurologically:  Mental status: The patient is awake, paying good attention, able to provide some of her history but details are provided by her daughter.         06/21/2022    2:34 PM  MMSE - Mini Mental State Exam  Orientation to time 5  Orientation to Place 5  Registration 3  Attention/ Calculation 3  Recall 3  Language- name 2 objects 2  Language- repeat 1  Language- follow 3 step command 3  Language- read & follow direction 0  Write a sentence 1  Copy design 1  Total score 27      On 06/21/2022: CDT: 4/4, AFT: 4/min.   Thought process is linear. Mood is normal and affect is normal.  Cranial nerves II - XII are as described above under HEENT exam.  Motor exam: Normal bulk, strength and tone is noted. There is no obvious action or resting tremor.  Fine motor skills and coordination: grossly intact.  Cerebellar testing: No dysmetria or intention tremor. There is no truncal or gait ataxia.  Sensory exam: intact to light touch in the upper and lower extremities.  Gait, station and balance: She stands without difficulty, posture is age-appropriate to mildly stooped in the upper back.  She walks without a walking aid, no shuffling but slowly and cautiously, mild decrease in arm swing bilaterally, stable.    Assessment and Plan:    In summary, [name] [name] is a 77 year old female with an underlying medical history of thyroid disease, reflux disease, low back pain, bradycardia,  anxiety, remote history of syncope, vitamin D deficiency, and overweight state, who presents for evaluation of her memory loss of approximately 2+ years' duration with progression noted. Her MMSE was 27 out of 30 in November 2023.  Laboratory testing in November 2023 showed a borderline low vitamin D level and mildly below normal vitamin B6 level.  She was advised to start vitamin D and B6 supplementation and get her levels rechecked by her primary care in about 3 months.  Per daughter, she has been taking vitamin B12 and vitamin D supplements but not vitamin B6.  They are reminded to add some vitamin B6 and have her vitamin D and B6 levels rechecked by primary care in the next few months.  Her brain MRI from 08/17/2022 showed age-congruent brain volume and moderate chronic small vessel ischemia in the hemispheric Heinlen matter.  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  In the past, she had side effects from donepezil, particularly worsening bradycardia.  I would not like to try another medication in the same family such as Exelon or galantamine.  Today I suggested we start her on generic Namenda 5 mg strength 1 pill at bedtime for 2 weeks and increase to twice daily thereafter.  At the next appointment we can consider increasing it to a maintenance dose of 10 mg twice daily if she tolerates the medication.  We talked about expectations and possible common side effects as well as limitations of memory medications.  We again talked about the importance of maintaining a healthy lifestyle and staying active mentally and physically.  She is encouraged to drink more water.  We talked about the importance of fall prevention.  She is advised to follow-up to see one of our nurse practitioners routinely in 4 to 6 months, sooner if needed.  I answered all the questions today and the patient and her daughter were in agreement. I spent  40 minutes in total face-to-face time and in reviewing records during pre-charting, more than 50% of which was spent in counseling and coordination of care, reviewing test results, reviewing medications and treatment regimen and/or in discussing or reviewing the diagnosis of memory loss, the prognosis and treatment options. Pertinent laboratory and imaging test results that were available during this visit with the patient were reviewed by me and considered in my medical decision making (see chart for details).

## 2022-12-22 DIAGNOSIS — L03312 Cellulitis of back [any part except buttock]: Secondary | ICD-10-CM | POA: Diagnosis not present

## 2023-01-05 ENCOUNTER — Emergency Department (HOSPITAL_COMMUNITY)
Admission: EM | Admit: 2023-01-05 | Discharge: 2023-01-05 | Discharge status: Against Medical Advice | Payer: Medicare HMO | Attending: Emergency Medicine | Admitting: Emergency Medicine

## 2023-01-05 ENCOUNTER — Encounter (HOSPITAL_COMMUNITY): Payer: Self-pay | Admitting: Emergency Medicine

## 2023-01-05 ENCOUNTER — Other Ambulatory Visit: Payer: Self-pay

## 2023-01-05 DIAGNOSIS — I10 Essential (primary) hypertension: Secondary | ICD-10-CM | POA: Diagnosis not present

## 2023-01-05 DIAGNOSIS — G8929 Other chronic pain: Secondary | ICD-10-CM | POA: Insufficient documentation

## 2023-01-05 DIAGNOSIS — R109 Unspecified abdominal pain: Secondary | ICD-10-CM | POA: Insufficient documentation

## 2023-01-05 DIAGNOSIS — Z5321 Procedure and treatment not carried out due to patient leaving prior to being seen by health care provider: Secondary | ICD-10-CM | POA: Diagnosis not present

## 2023-01-05 DIAGNOSIS — Z888 Allergy status to other drugs, medicaments and biological substances status: Secondary | ICD-10-CM | POA: Diagnosis not present

## 2023-01-05 DIAGNOSIS — M545 Low back pain, unspecified: Secondary | ICD-10-CM | POA: Diagnosis not present

## 2023-01-05 DIAGNOSIS — M549 Dorsalgia, unspecified: Secondary | ICD-10-CM | POA: Insufficient documentation

## 2023-01-05 DIAGNOSIS — K5730 Diverticulosis of large intestine without perforation or abscess without bleeding: Secondary | ICD-10-CM | POA: Diagnosis not present

## 2023-01-05 DIAGNOSIS — Z881 Allergy status to other antibiotic agents status: Secondary | ICD-10-CM | POA: Diagnosis not present

## 2023-01-05 DIAGNOSIS — I700 Atherosclerosis of aorta: Secondary | ICD-10-CM | POA: Diagnosis not present

## 2023-01-05 DIAGNOSIS — Z880 Allergy status to penicillin: Secondary | ICD-10-CM | POA: Diagnosis not present

## 2023-01-05 LAB — BASIC METABOLIC PANEL
Anion gap: 7 (ref 5–15)
BUN: 13 mg/dL (ref 8–23)
CO2: 26 mmol/L (ref 22–32)
Calcium: 9.1 mg/dL (ref 8.9–10.3)
Chloride: 105 mmol/L (ref 98–111)
Creatinine, Ser: 0.96 mg/dL (ref 0.44–1.00)
GFR, Estimated: >60 mL/min (ref >60)
Glucose, Bld: 114 mg/dL — ABNORMAL HIGH (ref 70–99)
Potassium: 4 mmol/L (ref 3.5–5.1)
Sodium: 138 mmol/L (ref 135–145)

## 2023-01-05 LAB — CBC
HCT: 45 % (ref 36.0–46.0)
Hemoglobin: 14.7 g/dL (ref 12.0–15.0)
MCH: 29 pg (ref 26.0–34.0)
MCHC: 32.7 g/dL (ref 30.0–36.0)
MCV: 88.8 fL (ref 80.0–100.0)
Platelets: 213 10*3/uL (ref 150–400)
RBC: 5.07 MIL/uL (ref 3.87–5.11)
RDW: 13 % (ref 11.5–15.5)
WBC: 8.5 10*3/uL (ref 4.0–10.5)
nRBC: 0 % (ref 0.0–0.2)

## 2023-01-05 MED ORDER — IBUPROFEN 400 MG PO TABS
400.0000 mg | ORAL_TABLET | Freq: Once | ORAL | Status: AC
  Administered 2023-01-05: 400 mg via ORAL
  Filled 2023-01-05: qty 1

## 2023-01-05 MED ORDER — HYDROCODONE-ACETAMINOPHEN 5-325 MG PO TABS
1.0000 | ORAL_TABLET | Freq: Once | ORAL | Status: AC
  Administered 2023-01-05: 1 via ORAL
  Filled 2023-01-05: qty 1

## 2023-01-05 NOTE — ED Provider Triage Note (Signed)
Emergency Medicine Provider Triage Evaluation Note  [name] [name] , a 77 y.o. female  was evaluated in triage.  Pt complains of back pain.  Pt has chronic back pain  Review of Systems  Positive:  Negative:   Physical Exam  BP 120/75 Comment: couldn't get an oral temp  Pulse (!) 57   Temp 97.7 F (36.5 C) (Temporal)   Resp 18   Ht 5\' 6"  (1.676 m)   Wt 74.4 kg   SpO2 99%   BMI 26.47 kg/m  Gen:   Awake, uncomfortable Resp:  Normal effort  MSK:   Moves extremities without difficulty  Other:  Tender flank area  Medical Decision Making  Medically screening exam initiated at 1:31 PM.  Appropriate orders placed.  [name] [name] was informed that the remainder of the evaluation will be completed by another provider, this initial triage assessment does not replace that evaluation, and the importance of remaining in the ED until their evaluation is complete.     Elson Areas, [place] 01/05/23 1332

## 2023-01-05 NOTE — ED Triage Notes (Addendum)
Pt via POV c/o left flank pain x 3 days. She was playing with her grandchildren and thinks she may have pulled a muscle. No prior hx renal stones, no blood in urine, no urinary symptoms. She reports some nausea and 1 episode of emesis today.  Pt states pain feels like muscle spasms and she appears uncomfortable in triage.

## 2023-02-22 DIAGNOSIS — E039 Hypothyroidism, unspecified: Secondary | ICD-10-CM | POA: Diagnosis not present

## 2023-02-22 DIAGNOSIS — E559 Vitamin D deficiency, unspecified: Secondary | ICD-10-CM | POA: Diagnosis not present

## 2023-03-07 ENCOUNTER — Ambulatory Visit: Payer: Medicare HMO | Admitting: Adult Health

## 2023-03-07 ENCOUNTER — Encounter: Payer: Self-pay | Admitting: Adult Health

## 2023-03-07 DIAGNOSIS — R413 Other amnesia: Secondary | ICD-10-CM | POA: Diagnosis not present

## 2023-03-07 MED ORDER — MEMANTINE HCL 10 MG PO TABS: 10.0000 mg | ORAL_TABLET | Freq: Every day | ORAL | 5 refills | Status: DC

## 2023-03-07 NOTE — Progress Notes (Signed)
[facility] [address] [place]. [place] [address] [phone]       OFFICE FOLLOW UP NOTE  [name] Date of Birth:  [birth year] Medical Record Number:  [id number]    Is: Dr. Frances Furbish Reason for visit: Memory loss    SUBJECTIVE:   CHIEF COMPLAINT:  Chief Complaint  Patient presents with   Follow-up    Rm 8, here with daughter [name] [name], husband [name] [name] Pt is here for follow up on memory loss. Daughter states pt doesn't follow conversations, states she is confusing grandchildren.    Follow-up visit  Prior visit: 10/17/2022 with Dr. Frances Furbish  Brief HPI:   [name] [name] is a 77 y.o. female who was initially seen by Dr. Frances Furbish on 06/21/2022 for memory loss concerns over the prior 2 years.  Obtain brain MRI which showed moderate chronic small vessel ischemia but otherwise unremarkable.  Lab work showed borderline low vitamin D level and mildly below normal vitamin B6 level, recommended supplement and recheck levels with PCP. MMSE 27/30 at initial visit.  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   Interval history:  Accompanied by her daughter and husband. Family believes her memory has declined since the prior visit. Has difficulty with short term and long term memory, can get confused at times, and difficulty following/comprehending conversations. Reports good days and bad days. Sleeps well at night, will take frequent naps during the day. Does do cross word puzzles occasionally. Goes to church but otherwise does not leave the house much. Currently on Namenda 5mg  nightly, tolerating well. Daughter unable to give morning dose as she already takes Synthroid in the morning and already has difficulty getting her mother to take pills twice daily, let alone 3 times a day. No behavioral concerns. MMSE today 26/30 (prior 27/30)      ROS:   14 system review of systems performed and negative with exception of those listed in  HPI  PMH:  Past Medical History:  Diagnosis Date   Anxiety    GERD (gastroesophageal reflux disease)    Thyroid disease     PSH:  Past Surgical History:  Procedure Laterality Date   ABDOMINAL HYSTERECTOMY     BLADDER REPAIR     COLONOSCOPY N/A 04/29/2018   Procedure: COLONOSCOPY;  Surgeon: Malissa Hippo, MD;  Location: [facility];  Service: Endoscopy;  Laterality: N/A;  2:35   DILATION AND CURETTAGE OF UTERUS     times 4   I & D EXTREMITY  06/28/2012   Procedure: MINOR IRRIGATION AND DEBRIDEMENT EXTREMITY;  Surgeon: Wyn Forster., MD;  Location: [facility];  Service: Orthopedics;  Laterality: Right;  Debride Mucoid Cyst, Debride Joint    POLYPECTOMY  04/29/2018   Procedure: POLYPECTOMY INTESTINAL;  Surgeon: Malissa Hippo, MD;  Location: [facility];  Service: Endoscopy;;    Social History:  Social History   Socioeconomic History   Marital status: Married    Spouse name: Not on file   Number of children: Not on file   Years of education: Not on file   Highest education level: Not on file  Occupational History   Not on file  Tobacco Use   Smoking status: Never   Smokeless tobacco: Never  Substance and Sexual Activity   Alcohol use: No    Alcohol/week: 0.0 standard drinks of alcohol   Drug use: No   Sexual activity: Not on file  Other Topics Concern  Not on file  Social History Narrative   Not on file   Social Determinants of Health   Financial Resource Strain: Not on file  Food Insecurity: Not on file  Transportation Needs: Not on file  Physical Activity: Not on file  Stress: Not on file  Social Connections: Not on file  Intimate Partner Violence: Not on file    Family History:  Family History  Problem Relation Age of Onset   Cancer Father    Alzheimer's disease Father     Medications:   Current Outpatient Medications on File Prior to Visit  Medication Sig Dispense Refill   atorvastatin (LIPITOR) 10 MG tablet Take 10  mg by mouth daily.     Cholecalciferol (VITAMIN D-3 PO) Take by mouth.     Cyanocobalamin (B-12 PO) Take by mouth.     cyanocobalamin (VITAMIN B12) 500 MCG tablet Take 500 mcg by mouth daily.     ibuprofen (ADVIL,MOTRIN) 200 MG tablet Take 200 mg by mouth as needed (pain).     levothyroxine (SYNTHROID) 50 MCG tablet Take 50 mcg by mouth every other day.     levothyroxine (SYNTHROID) 75 MCG tablet Take 75 mcg by mouth every other day.     memantine (NAMENDA) 5 MG tablet Take 1 tablet (5 mg total) by mouth 2 (two) times daily. Follow instructions provided in after visit summary 60 tablet 5   methocarbamol (ROBAXIN) 500 MG tablet Take 1 tablet (500 mg total) by mouth every 6 (six) hours as needed for muscle spasms. 12 tablet 0   omeprazole (PRILOSEC) 20 MG capsule Take 1 capsule (20 mg total) by mouth daily. 30 capsule 1   ondansetron (ZOFRAN-ODT) 4 MG disintegrating tablet Take by mouth.     meloxicam (MOBIC) 15 MG tablet Take 15 mg by mouth daily. (Patient not taking: Reported on 03/07/2023)     valACYclovir (VALTREX) 1000 MG tablet Take 1 tablet (1,000 mg total) by mouth 3 (three) times daily. (Patient not taking: Reported on 03/07/2023) 21 tablet 0   No current facility-administered medications on file prior to visit.    Allergies:   Allergies  Allergen Reactions   Prednisone Other (See Comments)    Rectal bleeding Other reaction(s): Other (See Comments) Rectal bleeding   Amoxicillin-Pot Clavulanate Hives and Rash   Ciprofloxacin Rash      OBJECTIVE:  Physical Exam  Vitals:   03/07/23 1443  BP: 127/74  Pulse: 63  Weight: 164 lb (74.4 kg)  Height: 5\' 3"  (1.6 m)   Body mass index is 29.05 kg/m. No results found.  General: well developed, well nourished, very pleasant elderly Caucasian female, seated, in no evident distress Head: head normocephalic and atraumatic.   Neck: supple with no carotid or supraclavicular bruits Cardiovascular: regular rate and rhythm, no  murmurs Musculoskeletal: no deformity Skin:  no rash/petichiae Vascular:  Normal pulses all extremities   Neurologic Exam Mental Status: Awake and fully alert. Oriented to place and time. Recent and remote memory impaired. Attention span, concentration and fund of knowledge impaired. Mood and affect appropriate.  Cranial Nerves: Pupils equal, briskly reactive to light. Extraocular movements full without nystagmus. Visual fields full to confrontation. Hearing intact. Facial sensation intact. Face, tongue, palate moves normally and symmetrically.  Motor: Normal bulk and tone. Normal strength in all tested extremity muscles Sensory.: intact to touch , pinprick , position and vibratory sensation.  Coordination: Rapid alternating movements normal in all extremities. Finger-to-nose and heel-to-shin performed accurately bilaterally. Gait and Station: Arises from  chair without difficulty. Stance is normal. Gait demonstrates normal stride length and balance  Reflexes: 1+ and symmetric. Toes downgoing.       03/07/2023    2:48 PM 06/21/2022    2:34 PM  MMSE - Mini Mental State Exam  Orientation to time 5 5  Orientation to Place 4 5  Registration 3 3  Attention/ Calculation 4 3  Recall 2 3  Language- name 2 objects 2 2  Language- repeat 1 1  Language- follow 3 step command 3 3  Language- read & follow direction 1 0  Write a sentence 1 1  Copy design 0 1  Total score 26 27        ASSESSMENT: [name] [name] is a 77 y.o. year old female with progressive memory loss since around 2021.  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     PLAN:  Cognitive impairment :  Gradual decline per family MMSE 26/30 (prior 27/30) Recommend increasing Namenda to 10 mg nightly, daughter wishes to avoid morning dosage Prior intolerance to Aricept Discussed neuropsychology evaluation -family will further consider and call if interested Discussed increasing daytime  physical activity, routine memory exercises and socialization.  Also discussed maintaining a healthy diet and ensuring adequate sleep     Follow up in 6 months or call earlier if needed   CC:  PCP: Burdine, Ananias Pilgrim, MD    I spent 36 minutes of face-to-face and non-face-to-face time with patient and family.  This included previsit chart review, lab review, study review, order entry, electronic health record documentation, patient education and discussion regarding above diagnoses and treatment plan and answered all other questions to patient and family's satisfaction  Ihor Austin, [facility]  [facility] [address] [place], [place] [address]  Phone [phone] Fax [phone] Note: This document was prepared with digital dictation and possible smart phrase technology. Any transcriptional errors that result from this process are unintentional.

## 2023-03-07 NOTE — Patient Instructions (Addendum)
Your Plan:  Increase Namenda to 10mg  nightly - can consider further increasing in the future if needed  Consider doing neuropsychology evaluation for neurocognitive testing - if you are interested in pursing please let me know     Follow up in 6 months or call earlier if needed     Thank you for coming to see [place] at [facility]. I hope we have been able to provide you high quality care today.  You may receive a patient satisfaction survey over the next few weeks. We would appreciate your feedback and comments so that we may continue to improve ourselves and the health of our patients.

## 2023-03-27 DIAGNOSIS — Z681 Body mass index (BMI) 19 or less, adult: Secondary | ICD-10-CM | POA: Diagnosis not present

## 2023-03-27 DIAGNOSIS — L03116 Cellulitis of left lower limb: Secondary | ICD-10-CM | POA: Diagnosis not present

## 2023-03-27 DIAGNOSIS — L03115 Cellulitis of right lower limb: Secondary | ICD-10-CM | POA: Diagnosis not present

## 2023-03-29 ENCOUNTER — Telehealth: Payer: Self-pay | Admitting: Adult Health

## 2023-03-29 NOTE — Telephone Encounter (Signed)
Pt's daughter is calling to report that things are worsening regarding pt's memory.  She'd like to move forward regarding increasing the dose on pt's  memantine (NAMENDA) 10 MG tablet

## 2023-03-29 NOTE — Telephone Encounter (Signed)
Called the daughter back she states per Shanda Bumps and her conversation she is aware that the pt can't take anything that is twice a day so Shanda Bumps had advised that she could take at one time and increase to 15 mg at bedtime if needed.  I advised that they could start that. She has about 9 tablet left of the 10 tablet. She has about 25 tablet left of the 5 mg tablet. She will give a 10mg  + 5mg  tablet for the next 9 days and then the remaining amount will be 3 of the 5 mg tablet until she runs low and then she will reach out and let us know how well patient is tolerating before running out. She has a total of about 14 days worth of the remaining tablets. She will reach out before running out and let us know if we need to send a updated script to reflect her continuing the 15 mg dose or increase to 20 mg dose. I will make sure Shanda Bumps is aware of this plan

## 2023-03-29 NOTE — Telephone Encounter (Signed)
Noted! Thank you

## 2023-04-11 DIAGNOSIS — F0150 Vascular dementia without behavioral disturbance: Secondary | ICD-10-CM | POA: Diagnosis not present

## 2023-04-11 DIAGNOSIS — E039 Hypothyroidism, unspecified: Secondary | ICD-10-CM | POA: Diagnosis not present

## 2023-04-11 DIAGNOSIS — F5104 Psychophysiologic insomnia: Secondary | ICD-10-CM | POA: Diagnosis not present

## 2023-04-11 DIAGNOSIS — I495 Sick sinus syndrome: Secondary | ICD-10-CM | POA: Diagnosis not present

## 2023-04-11 DIAGNOSIS — Z6827 Body mass index (BMI) 27.0-27.9, adult: Secondary | ICD-10-CM | POA: Diagnosis not present

## 2023-04-11 DIAGNOSIS — I10 Essential (primary) hypertension: Secondary | ICD-10-CM | POA: Diagnosis not present

## 2023-04-11 DIAGNOSIS — E7849 Other hyperlipidemia: Secondary | ICD-10-CM | POA: Diagnosis not present

## 2023-04-11 DIAGNOSIS — Z7189 Other specified counseling: Secondary | ICD-10-CM | POA: Diagnosis not present

## 2023-04-11 DIAGNOSIS — Z23 Encounter for immunization: Secondary | ICD-10-CM | POA: Diagnosis not present

## 2023-05-01 ENCOUNTER — Telehealth: Payer: Self-pay | Admitting: Adult Health

## 2023-05-01 MED ORDER — MEMANTINE HCL 10 MG PO TABS: 10.0000 mg | ORAL_TABLET | Freq: Two times a day (BID) | ORAL | 5 refills | Status: DC

## 2023-05-01 NOTE — Telephone Encounter (Signed)
Pt's daughter would like a call to discuss taking the  memantine (NAMENDA) 10 MG tablet from 15 mg to the max dose of 20mg , please call daughter to discuss.

## 2023-05-01 NOTE — Addendum Note (Signed)
Addended by: Judi Cong on: 05/01/2023 03:04 PM   Modules accepted: Orders

## 2023-05-01 NOTE — Telephone Encounter (Signed)
Called the daughter back and she states that she has not noticed improvement with the medication being increased to 15 mg however she has tolerated the dose with no problems. Per our previous discussion, Shanda Bumps had advised that pt could increase to the 20 mg daily at bedtime. A new script is needed and I will send in the updated script to the pharmacy on file confirmed correct by daughter.

## 2023-05-07 DIAGNOSIS — K219 Gastro-esophageal reflux disease without esophagitis: Secondary | ICD-10-CM | POA: Diagnosis not present

## 2023-05-07 DIAGNOSIS — R001 Bradycardia, unspecified: Secondary | ICD-10-CM | POA: Diagnosis not present

## 2023-05-07 DIAGNOSIS — Z8249 Family history of ischemic heart disease and other diseases of the circulatory system: Secondary | ICD-10-CM | POA: Diagnosis not present

## 2023-05-07 DIAGNOSIS — I129 Hypertensive chronic kidney disease with stage 1 through stage 4 chronic kidney disease, or unspecified chronic kidney disease: Secondary | ICD-10-CM | POA: Diagnosis not present

## 2023-05-07 DIAGNOSIS — I700 Atherosclerosis of aorta: Secondary | ICD-10-CM | POA: Diagnosis not present

## 2023-05-07 DIAGNOSIS — F0280 Dementia in other diseases classified elsewhere without behavioral disturbance: Secondary | ICD-10-CM | POA: Diagnosis not present

## 2023-05-07 DIAGNOSIS — F0150 Vascular dementia without behavioral disturbance: Secondary | ICD-10-CM | POA: Diagnosis not present

## 2023-05-07 DIAGNOSIS — M1990 Unspecified osteoarthritis, unspecified site: Secondary | ICD-10-CM | POA: Diagnosis not present

## 2023-05-07 DIAGNOSIS — R2681 Unsteadiness on feet: Secondary | ICD-10-CM | POA: Diagnosis not present

## 2023-05-07 DIAGNOSIS — E039 Hypothyroidism, unspecified: Secondary | ICD-10-CM | POA: Diagnosis not present

## 2023-05-07 DIAGNOSIS — N182 Chronic kidney disease, stage 2 (mild): Secondary | ICD-10-CM | POA: Diagnosis not present

## 2023-05-07 DIAGNOSIS — E785 Hyperlipidemia, unspecified: Secondary | ICD-10-CM | POA: Diagnosis not present

## 2023-07-16 DIAGNOSIS — I700 Atherosclerosis of aorta: Secondary | ICD-10-CM | POA: Diagnosis not present

## 2023-07-16 DIAGNOSIS — Z79890 Hormone replacement therapy (postmenopausal): Secondary | ICD-10-CM | POA: Diagnosis not present

## 2023-07-16 DIAGNOSIS — E079 Disorder of thyroid, unspecified: Secondary | ICD-10-CM | POA: Diagnosis not present

## 2023-07-16 DIAGNOSIS — F0390 Unspecified dementia without behavioral disturbance: Secondary | ICD-10-CM | POA: Diagnosis not present

## 2023-07-16 DIAGNOSIS — I517 Cardiomegaly: Secondary | ICD-10-CM | POA: Diagnosis not present

## 2023-07-16 DIAGNOSIS — Z79899 Other long term (current) drug therapy: Secondary | ICD-10-CM | POA: Diagnosis not present

## 2023-07-16 DIAGNOSIS — R0789 Other chest pain: Secondary | ICD-10-CM | POA: Diagnosis not present

## 2023-07-16 DIAGNOSIS — I119 Hypertensive heart disease without heart failure: Secondary | ICD-10-CM | POA: Diagnosis not present

## 2023-07-16 DIAGNOSIS — K219 Gastro-esophageal reflux disease without esophagitis: Secondary | ICD-10-CM | POA: Diagnosis not present

## 2023-07-16 DIAGNOSIS — Z881 Allergy status to other antibiotic agents status: Secondary | ICD-10-CM | POA: Diagnosis not present

## 2023-07-16 DIAGNOSIS — R079 Chest pain, unspecified: Secondary | ICD-10-CM | POA: Diagnosis not present

## 2023-08-19 NOTE — Progress Notes (Deleted)
 Cardiology Office Note    Date:  08/19/2023  ID:  [name] [name] 09-05-1945, MRN [id number] PCP:  Lari Elspeth BRAVO, MD  Cardiologist:  Jerel Balding, MD  Electrophysiologist:  None   Chief Complaint: ***  History of Present Illness: .    [name] [name] is a 78 y.o. female with visit-pertinent history of nephrolithiasis, mild Alzheimer's disease, treated hypothyroidism, presyncope and bradycardia.  First evaluated by Dr. Balding on 10/05/2021 for presyncope and bradycardia worsened while taking Aricept.  Per Dr. Tyrone notes she had had problems with fatigue, weakness and changes in eyesight and hearing.  Sleep but symptoms worsened while on Aricept.  She was evaluated in her PCPs office and had sinus bradycardia at 50 bpm, her Aricept was stopped.  At office visit her heart rate improved to 59 bpm and was feeling overall slightly better.  She denied any full-blown syncope.  She wore a cardiac monitor that showed predominant underlying rhythm was sinus rhythm, average heart rate of 58 bpm ranging from 42 bpm to 150 bpm.  She had 15 runs of supraventricular tachycardia, the run with the fastest interval lasting 11 beats with a max rate of 150 bpm, the longest lasting 13.3 seconds with an average rate of 113 bpm.  She had frequent isolated SVE's. On Dr. Tyrone review he noted it was an abnormal arrhythmia monitor due to the presence of persistent and moderate to severe sinus bradycardia with suggestion of chronotropic incompetence.  Was felt the findings were most consistent with age-related sinus node dysfunction, sick sinus syndrome.  On follow-up on 12/2021 she reported improved energy with no longer complaints of fatigue or weakness.  Dr. Balding offered the option of restarting Aricept but she would also need to have a dual-chamber permanent pacemaker to compensate for bradycardia.  Patient deferred pacemaker implantation.  Dr. Balding noted to avoid all medicines with negative  chronotropic effect.  Presented to [facility] ED on 07/16/2023 with complaints of left-sided chest pain that morning, on presentation she denied any further chest pain.  She denied any associated nausea or shortness of breath.  Her EKG without ischemic changes, at bedtime troponin 4.  She was discharged in stable condition with recommended follow-up with her PCP and cardiology.   Today she presents regarding episode of chest pain.  She reports that she  Chest pain:  Bradycardia:  Hypothyroidism:   Labwork independently reviewed: 07/16/2023: Hemoglobin 13, hematocrit 39.8, magnesium 1.8, sodium 146, potassium 4.3, creatinine 1.13, AST 8, ALT 16  ROS: .   *** denies chest pain, shortness of breath, lower extremity edema, fatigue, palpitations, melena, hematuria, hemoptysis, diaphoresis, weakness, presyncope, syncope, orthopnea, and PND.  All other systems are reviewed and otherwise negative.  Studies Reviewed: SABRA    EKG:  EKG is ordered today, personally reviewed, demonstrating ***     CV Studies:  Cardiac Studies & Procedures      ECHOCARDIOGRAM  ECHOCARDIOGRAM COMPLETE 06/04/2015  Narrative *[facility] [address] [place], [place] [address] [phone]  ------------------------------------------------------------------- Transthoracic Echocardiography  Patient:    [name], [name] MR #:       [id number] Study Date: 06/04/2015 Gender:     F Age:        68 Height:     165.1 cm Weight:     78.5 kg BSA:        1.92 m^2 Pt. Status: Room:       A327  ATTENDING    Ricky Fines [id number]  ADMITTING    Lama, [name], [name] REFERRING    Drusilla Sabas GORMAN PERFORMING   [facility] [place] SONOGRAPHER  Aida Pizza, RCS  cc:  ------------------------------------------------------------------- LV EF: 60% -   65%  ------------------------------------------------------------------- Indications:      Syncope 780.2.  TIA  435.9.  ------------------------------------------------------------------- Study Conclusions  - Left ventricle: The cavity size was normal. Systolic function was normal. The estimated ejection fraction was in the range of 60% to 65%. Wall motion was normal; there were no regional wall motion abnormalities. - Atrial septum: No defect or patent foramen ovale was identified.  Transthoracic echocardiography.  M-mode, complete 2D, spectral Doppler, and color Doppler.  Birthdate:  Patient birthdate: 08/20/1945.  Age:  Patient is 78 yr old.  Sex:  Gender: female. BMI: 28.8 kg/m^2.  Blood pressure:     120/68  Patient status: Inpatient.  Study date:  Study date: 06/04/2015. Study time: 08:32 AM.  Location:  Bedside.  -------------------------------------------------------------------  ------------------------------------------------------------------- Left ventricle:  The cavity size was normal. Systolic function was normal. The estimated ejection fraction was in the range of 60% to 65%. Wall motion was normal; there were no regional wall motion abnormalities.  ------------------------------------------------------------------- Aortic valve:   Trileaflet; normal thickness, mildly calcified leaflets. Mobility was not restricted.  Doppler:  Transvalvular velocity was within the normal range. There was no stenosis. There was no regurgitation.  ------------------------------------------------------------------- Aorta:  The aorta was normal, not dilated, and non-diseased. Aortic root: The aortic root was normal in size.  ------------------------------------------------------------------- Mitral valve:   Structurally normal valve.   Mobility was not restricted.  Doppler:  Transvalvular velocity was within the normal range. There was no evidence for stenosis. There was no regurgitation.    Peak gradient (D): 3 mm  Hg.  ------------------------------------------------------------------- Left atrium:  The atrium was normal in size.  ------------------------------------------------------------------- Atrial septum:  No defect or patent foramen ovale was identified.  ------------------------------------------------------------------- Right ventricle:  The cavity size was normal. Wall thickness was normal. Systolic function was normal.  ------------------------------------------------------------------- Pulmonic valve:    Doppler:  Transvalvular velocity was within the normal range. There was no evidence for stenosis.  ------------------------------------------------------------------- Tricuspid valve:   Structurally normal valve.    Doppler: Transvalvular velocity was within the normal range. There was trivial regurgitation.  ------------------------------------------------------------------- Pulmonary artery:   The main pulmonary artery was normal-sized. Systolic pressure was within the normal range.  ------------------------------------------------------------------- Right atrium:  The atrium was normal in size.  ------------------------------------------------------------------- Pericardium:  The pericardium was normal in appearance. There was no pericardial effusion.  ------------------------------------------------------------------- Systemic veins: Inferior vena cava: The vessel was normal in size.  ------------------------------------------------------------------- Post procedure conclusions Ascending Aorta:  - The aorta was normal, not dilated, and non-diseased.  ------------------------------------------------------------------- Measurements  Left ventricle                           Value        Reference LV ID, ED, PLAX chordal                  44.4  mm     43 - 52 LV ID, ES, PLAX chordal                  30.8  mm     23 - 38 LV fx shortening, PLAX chordal           31     %      >=  29 LV PW thickness, ED                      11.1  mm     --------- IVS/LV PW ratio, ED                      0.83         <=1.3 LV ejection fraction, 1-p A4C            67    %      --------- LV end-diastolic volume, 2-p             59    ml     --------- LV end-systolic volume, 2-p              19    ml     --------- LV ejection fraction, 2-p                68    %      --------- Stroke volume, 2-p                       40    ml     --------- LV end-diastolic volume/bsa, 2-p         31    ml/m^2 --------- LV end-systolic volume/bsa, 2-p          10    ml/m^2 --------- Stroke volume/bsa, 2-p                   20.9  ml/m^2 --------- LV e&', lateral                           9.79  cm/s   --------- LV E/e&', lateral                         8.3          --------- LV e&', medial                            6.53  cm/s   --------- LV E/e&', medial                          12.45        --------- LV e&', average                           8.16  cm/s   --------- LV E/e&', average                         9.96         ---------  Ventricular septum                       Value        Reference IVS thickness, ED                        9.22  mm     ---------  LVOT  Value        Reference LVOT ID, S                               20    mm     --------- LVOT area                                3.14  cm^2   ---------  Aorta                                    Value        Reference Aortic root ID, ED                       34    mm     ---------  Left atrium                              Value        Reference LA ID, A-P, ES                           34    mm     --------- LA ID/bsa, A-P                           1.77  cm/m^2 <=2.2 LA volume, S                             44.3  ml     --------- LA volume/bsa, S                         23.1  ml/m^2 --------- LA volume, ES, 1-p A4C                   40.9  ml     --------- LA volume/bsa, ES, 1-p A4C               21.3   ml/m^2 --------- LA volume, ES, 1-p A2C                   45.5  ml     --------- LA volume/bsa, ES, 1-p A2C               23.7  ml/m^2 ---------  Mitral valve                             Value        Reference Mitral E-wave peak velocity              81.3  cm/s   --------- Mitral A-wave peak velocity              64.1  cm/s   --------- Mitral deceleration time         (H)     257   ms     150 - 230 Mitral peak gradient, D  3     mm Hg  --------- Mitral E/A ratio, peak                   1.3          ---------  Systemic veins                           Value        Reference Estimated CVP                            3     mm Hg  ---------  Right ventricle                          Value        Reference RV ID, ED, PLAX                          26    mm     19 - 38 TAPSE                                    23.8  mm     --------- RV s&', lateral, S                        12.3  cm/s   ---------  Legend: (L)  and  (H)  mark values outside specified reference range.  ------------------------------------------------------------------- Prepared and Electronically Authenticated by  Maude Emmer, M.D. 2016-10-21T10:59:16   MONITORS  LONG TERM MONITOR (3-14 DAYS) 10/24/2021  Narrative  Dominant rhythm is marked sinus bradycardia with a minimum heart rate of 42 bpm and an average heart rate of 58 bpm.. There appears to be blunted circadian variation. During sinus rhythm, the heart rate rarely approaches 100 bpm.  Frequent PACs are seen (roughly 11% of all beats) there were occasional brief episodes of nonsustained atrial tachycardia, maximum 11 beats. Atrial ectopy and episodes of atrial tachycardia are commonly seen at night.  There are no episodes of significant ventricular arrhythmia and no evidence of AV conduction abnormalities.  Abnormal arrhythmia monitor due to the presence of persistent and moderate to severe sinus bradycardia with suggestion of chronotropic  incompetence. There are frequent episodes of atrial ectopy and brief nonsustained atrial tachycardia, but no atrial fibrillation is seen. She is no longer receiving any medications with negative chronotropic effect.  She is on levothyroxine  supplement, but a recent TSH evaluation is not available for review. If the patient is euthyroid, the findings are most consistent with age-related sinus node dysfunction (sick sinus syndrome). Pacemaker therapy can be considered if the patient has prominent complaints of fatigue/exertional intolerance.  Patch Wear Time:  2 days and 22 hours (2023-03-01T14:18:01-499 to 2023-03-04T12:50:57-0500)  Patient had a min HR of 42 bpm, max HR of 150 bpm, and avg HR of 58 bpm. Predominant underlying rhythm was Sinus Rhythm. 15 Supraventricular Tachycardia runs occurred, the run with the fastest interval lasting 11 beats with a max rate of 150 bpm, the longest lasting 13.3 secs with an avg rate of 113 bpm. Isolated SVEs were frequent (10.7%, 24549), SVE Couplets were rare (<1.0%, 508), and SVE Triplets were rare (<1.0%, 121). Isolated VEs were rare (<1.0%), and no VE Couplets  or VE Triplets were present.            Current Reported Medications:.    No outpatient medications have been marked as taking for the 08/20/23 encounter (Appointment) with Wei Newbrough D, NP.    Physical Exam:    VS:  There were no vitals taken for this visit.   Wt Readings from Last 3 Encounters:  03/07/23 164 lb (74.4 kg)  01/05/23 164 lb (74.4 kg)  10/17/22 162 lb 6.4 oz (73.7 kg)    GEN: Well nourished, well developed in no acute distress NECK: No JVD; No carotid bruits CARDIAC: ***RRR, no murmurs, rubs, gallops RESPIRATORY:  Clear to auscultation without rales, wheezing or rhonchi  ABDOMEN: Soft, non-tender, non-distended EXTREMITIES:  No edema; No acute deformity   Asessement and Plan:.     ***     Disposition: F/u with ***  Signed, Erubiel Manasco D Seng Fouts, NP

## 2023-08-20 ENCOUNTER — Ambulatory Visit: Payer: Medicare HMO | Attending: Cardiology | Admitting: Cardiology

## 2023-08-20 DIAGNOSIS — R001 Bradycardia, unspecified: Secondary | ICD-10-CM

## 2023-08-20 DIAGNOSIS — R079 Chest pain, unspecified: Secondary | ICD-10-CM

## 2023-08-20 DIAGNOSIS — E039 Hypothyroidism, unspecified: Secondary | ICD-10-CM

## 2023-09-12 ENCOUNTER — Ambulatory Visit: Payer: Medicare HMO | Admitting: Adult Health

## 2023-09-12 ENCOUNTER — Encounter: Payer: Self-pay | Admitting: Adult Health

## 2023-09-12 VITALS — BP 141/85 | HR 57 | Ht 66.0 in | Wt 162.5 lb

## 2023-09-12 DIAGNOSIS — G3184 Mild cognitive impairment, so stated: Secondary | ICD-10-CM | POA: Diagnosis not present

## 2023-09-12 MED ORDER — MEMANTINE HCL 10 MG PO TABS: 10.0000 mg | ORAL_TABLET | Freq: Two times a day (BID) | ORAL | 11 refills | Status: AC

## 2023-09-12 NOTE — Patient Instructions (Addendum)
Your Plan:  Continue Namenda 20mg  nightly  Consider use of infusible medications such as Leqembi or Kisunla - if interested in pursing, please let me know  Ensure routine memory exercises and physical activity which can help slow memory decline     Follow up in 6 months or call earlier if needed     Thank you for coming to see [place] at [facility]. I hope we have been able to provide you high quality care today.  You may receive a patient satisfaction survey over the next few weeks. We would appreciate your feedback and comments so that we may continue to improve ourselves and the health of our patients.

## 2023-09-12 NOTE — Progress Notes (Signed)
[facility] [address] [place] [address] [phone]       OFFICE FOLLOW UP NOTE  [name] Date of Birth:  06/26/1946 Medical Record Number:  [id number]    Primary neurologist: Dr. Frances Furbish Reason for visit: Memory loss    SUBJECTIVE:   CHIEF COMPLAINT:  Chief Complaint  Patient presents with   Follow-up    Pt in room 8. Daughter [name] [name] and husband [name] [name] in room. Here for memory follow up. Family said memory has declined. Takes namenda to 10mg      Follow-up visit  Prior visit: 03/07/2023  Brief HPI:   [name] [name] is a 78 y.o. female who was initially seen by Dr. Frances Furbish on 06/21/2022 for memory loss concerns over the prior 2 years.  Obtain brain MRI which showed moderate chronic small vessel ischemia but otherwise unremarkable.  Lab work showed borderline low vitamin D level and mildly below normal vitamin B6 level, recommended supplement and recheck levels with PCP. MMSE 27/30 at initial visit.  At prior visit, memantine dosage increased to 10 mg nightly, which to avoid morning dosage.  During the interval time, family called requesting further increase of memantine to 15 mg nightly and eventually to 20 mg nightly.  Interval history:  Accompanied by her daughter and husband. Family believes her memory has continued to decline since the prior visit.  MMSE today 22/30 (prior 26/30). Currently taking namenda 20mg  nightly.  Has been mention she has been having more difficulties remembering to take her medications and he has been needing to assist with this more.  Daughter notes her personality has changed, she used to get upset very quickly about small things but now nothing seems to really bother her.  Denies any agitation, aggression, paranoia or hallucinations.  Sleeps well at night.  Does need to be reminded to eat.  Reports recent onset of left knee pain radiating down her leg, was having difficulty standing but seems to be gradually improving.   Has not been able to schedule appointment with PCP to have this further evaluated.     ROS:   14 system review of systems performed and negative with exception of those listed in HPI  PMH:  Past Medical History:  Diagnosis Date   Anxiety    GERD (gastroesophageal reflux disease)    Thyroid disease     PSH:  Past Surgical History:  Procedure Laterality Date   ABDOMINAL HYSTERECTOMY     BLADDER REPAIR     COLONOSCOPY N/A 04/29/2018   Procedure: COLONOSCOPY;  Surgeon: Malissa Hippo, MD;  [facility]: [facility];  Service: Endoscopy;  Laterality: N/A;  2:35   DILATION AND CURETTAGE OF UTERUS     times 4   I & D EXTREMITY  06/28/2012   Procedure: MINOR IRRIGATION AND DEBRIDEMENT EXTREMITY;  Surgeon: Wyn Forster., MD;  Location: [facility];  Service: Orthopedics;  Laterality: Right;  Debride Mucoid Cyst, Debride Joint    POLYPECTOMY  04/29/2018   Procedure: POLYPECTOMY INTESTINAL;  Surgeon: Malissa Hippo, MD;  Location: [facility];  Service: Endoscopy;;    Social History:  Social History   Socioeconomic History   Marital status: Married    Spouse name: Not on file   Number of children: Not on file   Years of education: Not on file   Highest education level: Not on file  Occupational History   Not on file  Tobacco Use   Smoking status: Never  Smokeless tobacco: Never  Substance and Sexual Activity   Alcohol use: No    Alcohol/week: 0.0 standard drinks of alcohol   Drug use: No   Sexual activity: Not on file  Other Topics Concern   Not on file  Social History Narrative   Not on file   Social Drivers of Health   Financial Resource Strain: Not on file  Food Insecurity: Not on file  Transportation Needs: Not on file  Physical Activity: Not on file  Stress: Not on file  Social Connections: Not on file  Intimate Partner Violence: Not on file    Family History:  Family History  Problem Relation Age of Onset   Cancer Father     Alzheimer's disease Father     Medications:   Current Outpatient Medications on File Prior to Visit  Medication Sig Dispense Refill   atorvastatin (LIPITOR) 10 MG tablet Take 10 mg by mouth daily.     Cholecalciferol (VITAMIN D-3 PO) Take by mouth.     cyanocobalamin (VITAMIN B12) 500 MCG tablet Take 500 mcg by mouth daily.     levothyroxine (SYNTHROID) 50 MCG tablet Take 50 mcg by mouth every other day.     levothyroxine (SYNTHROID) 75 MCG tablet Take 75 mcg by mouth every other day.     memantine (NAMENDA) 10 MG tablet Take 1 tablet (10 mg total) by mouth 2 (two) times daily. 60 tablet 5   omeprazole (PRILOSEC) 20 MG capsule Take 1 capsule (20 mg total) by mouth daily. 30 capsule 1   Cyanocobalamin (B-12 PO) Take by mouth. (Patient not taking: Reported on 09/12/2023)     ibuprofen (ADVIL,MOTRIN) 200 MG tablet Take 200 mg by mouth as needed (pain). (Patient not taking: Reported on 09/12/2023)     meloxicam (MOBIC) 15 MG tablet Take 15 mg by mouth daily. (Patient not taking: Reported on 09/12/2023)     methocarbamol (ROBAXIN) 500 MG tablet Take 1 tablet (500 mg total) by mouth every 6 (six) hours as needed for muscle spasms. (Patient not taking: Reported on 09/12/2023) 12 tablet 0   ondansetron (ZOFRAN-ODT) 4 MG disintegrating tablet Take by mouth. (Patient not taking: Reported on 09/12/2023)     valACYclovir (VALTREX) 1000 MG tablet Take 1 tablet (1,000 mg total) by mouth 3 (three) times daily. (Patient not taking: Reported on 09/12/2023) 21 tablet 0   No current facility-administered medications on file prior to visit.    Allergies:   Allergies  Allergen Reactions   Prednisone Other (See Comments)    Rectal bleeding Other reaction(s): Other (See Comments) Rectal bleeding   Amoxicillin-Pot Clavulanate Hives and Rash   Ciprofloxacin Rash      OBJECTIVE:  Physical Exam  Vitals:   09/12/23 1406  BP: (!) 141/85  Pulse: (!) 57  Weight: 162 lb 8 oz (73.7 kg)  Height: 5\' 6"  (1.676 m)    Body mass index is 26.23 kg/m. No results found.  General: well developed, well nourished, very pleasant elderly Caucasian female, seated, in no evident distress  Neurologic Exam Mental Status: Awake and fully alert. Oriented to place and time. Recent and remote memory impaired. Attention span, concentration and fund of knowledge impaired. Mood and affect appropriate.  Cranial Nerves: Pupils equal, briskly reactive to light. Extraocular movements full without nystagmus. Visual fields full to confrontation. Hearing intact. Facial sensation intact. Face, tongue, palate moves normally and symmetrically.  Motor: Normal bulk and tone. Normal strength in all tested extremity muscles Sensory.: intact to touch , pinprick ,  position and vibratory sensation.  Coordination: Rapid alternating movements normal in all extremities. Finger-to-nose and heel-to-shin performed accurately bilaterally. Gait and Station: Deferred Reflexes: 1+ and symmetric. Toes downgoing.       09/12/2023    2:13 PM 03/07/2023    2:48 PM 06/21/2022    2:34 PM  MMSE - Mini Mental State Exam  Orientation to time 4 5 5   Orientation to Place 4 4 5   Registration 3 3 3   Attention/ Calculation 1 4 3   Recall 3 2 3   Language- name 2 objects 2 2 2   Language- repeat 1 1 1   Language- follow 3 step command 2 3 3   Language- read & follow direction 1 1 0  Write a sentence 1 1 1   Copy design 0 0 1  Total score 22 26 27         ASSESSMENT: [name] [name] is a 78 y.o. year old female with progressive memory loss since around 2021.  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     PLAN:  Cognitive impairment :  Continued gradual decline per family MMSE 22/30 (prior 26/30) Continue memantine 20 mg nightly, daughter wishes to avoid morning dosage Prior intolerance to Aricept Declines interest in antiamyloid therapy for further neurocognitive testing Discussed increasing daytime physical  activity, routine memory exercises and socialization.  Also discussed maintaining a healthy diet and ensuring adequate sleep     Follow up in 6 months or call earlier if needed   CC:  PCP: Burdine, Ananias Pilgrim, MD    I spent 30 minutes of face-to-face and non-face-to-face time with patient and family.  This included previsit chart review, lab review, study review, order entry, electronic health record documentation, patient education and discussion regarding above diagnoses and treatment plan and answered all other questions to patient and family's satisfaction  Ihor Austin, [facility]  [facility] [address] [place], [place] [address]  Phone [phone] Fax [phone] Note: This document was prepared with digital dictation and possible smart phrase technology. Any transcriptional errors that result from this process are unintentional.

## 2023-09-15 DIAGNOSIS — R030 Elevated blood-pressure reading, without diagnosis of hypertension: Secondary | ICD-10-CM | POA: Diagnosis not present

## 2023-09-15 DIAGNOSIS — E663 Overweight: Secondary | ICD-10-CM | POA: Diagnosis not present

## 2023-09-15 DIAGNOSIS — S86912A Strain of unspecified muscle(s) and tendon(s) at lower leg level, left leg, initial encounter: Secondary | ICD-10-CM | POA: Diagnosis not present

## 2023-09-15 DIAGNOSIS — Z6827 Body mass index (BMI) 27.0-27.9, adult: Secondary | ICD-10-CM | POA: Diagnosis not present

## 2023-10-04 NOTE — Progress Notes (Signed)
 Cardiology Clinic Note   Patient Name: [name] [name] Date of Encounter: 10/08/2023  Primary Care Provider:  Juliette Alcide, MD Primary Cardiologist:  Thurmon Fair, MD  Patient Profile    [name] [name] 78 year old female presents the clinic today for follow-up evaluation of her chest discomfort and syncope.  Past Medical History    Past Medical History:  Diagnosis Date   Anxiety    GERD (gastroesophageal reflux disease)    Thyroid disease    Past Surgical History:  Procedure Laterality Date   ABDOMINAL HYSTERECTOMY     BLADDER REPAIR     COLONOSCOPY N/A 04/29/2018   Procedure: COLONOSCOPY;  Surgeon: Malissa Hippo, MD;  Location: [facility];  Service: Endoscopy;  Laterality: N/A;  2:35   DILATION AND CURETTAGE OF UTERUS     times 4   I & D EXTREMITY  06/28/2012   Procedure: MINOR IRRIGATION AND DEBRIDEMENT EXTREMITY;  Surgeon: Wyn Forster., MD;  Location: [facility];  Service: Orthopedics;  Laterality: Right;  Debride Mucoid Cyst, Debride Joint    POLYPECTOMY  04/29/2018   Procedure: POLYPECTOMY INTESTINAL;  Surgeon: Malissa Hippo, MD;  Location: [facility];  Service: Endoscopy;;    Allergies  Allergies  Allergen Reactions   Prednisone Other (See Comments)    Rectal bleeding Other reaction(s): Other (See Comments) Rectal bleeding   Amoxicillin-Pot Clavulanate Hives and Rash   Ciprofloxacin Rash    History of Present Illness    [name] [name] has a PMH of GERD, hypertension, and chest discomfort.  Her PMH also includes nephrolithiasis, mild Altheimer's disease, and hypothyroidism.  She was previously noted to have worsening presyncope and bradycardia with taking Aricept.  After she discontinued Aricept she noted more energy and no longer complained of weakness or fatigue.  She was noted to have chronotropic incompetence.  After stopping Aricept she was noted to have some deterioration/progression of short-term memory loss.  She was  seen in follow-up by Dr. Royann Shivers 12/14/2021.  During that time she denied chest pain, dyspnea, orthopnea, PND, weight gain, cough, and wheezing.   She presented to the emergency department on 07/16/23.  She reported onset of chest discomfort that morning.  She described intermittent sharp pain.  She denied chest pain during exam.  She denied associated changes with her breathing, nausea and vomiting.  She denied abdominal pain and changes in her bowel and urinary habits.  She denied fever.  Her blood pressure was noted to be 161/93.  Her pulse was 61.  On recheck her blood pressure was noted to be 146/75.  She was maintained on room air.  She received aspirin and sublingual nitroglycerin.  CXR showed cardiac enlargement, calcification of the aorta and no acute cardiopulmonary disease.  Her EKG was without ST elevation and her cardiac troponins were unremarkable.  She was felt to be low risk for ACS and dissection.  She was instructed to follow-up with cardiology as an outpatient.  She presents to the clinic today for follow-up evaluation and states she has not had any further episodes of chest discomfort.  She presents with her daughter and her husband.  Her husband reports that she has been somewhat physically active walking her dog regularly.  We reviewed her recent emergency department visit and diagnostic testing.  It appears that her symptoms were related to acid reflux.  They expressed understanding.  Her blood pressure is well-controlled.  She is tolerating her medications well.  I  will give her the salty 6 diet sheet, have her increase her physical activity as tolerated and plan follow-up in 6 months.  Today she denies chest pain, shortness of breath, lower extremity edema, fatigue, palpitations, melena, hematuria, hemoptysis, diaphoresis, weakness, presyncope, syncope, orthopnea, and PND.   Home Medications    Prior to Admission medications   Medication Sig Start Date End Date Taking? Authorizing  Provider  atorvastatin (LIPITOR) 10 MG tablet Take 10 mg by mouth daily. 04/25/22   [provider]  Cholecalciferol (VITAMIN D-3 PO) Take by mouth.    [provider]  cyanocobalamin (VITAMIN B12) 500 MCG tablet Take 500 mcg by mouth daily.    [provider]  levothyroxine (SYNTHROID) 50 MCG tablet Take 50 mcg by mouth every other day. 08/31/21   [provider]  levothyroxine (SYNTHROID) 75 MCG tablet Take 75 mcg by mouth every other day. 08/31/21   [provider]  memantine (NAMENDA) 10 MG tablet Take 1 tablet (10 mg total) by mouth 2 (two) times daily. 09/12/23   Ihor Austin, NP  omeprazole (PRILOSEC) 20 MG capsule Take 1 capsule (20 mg total) by mouth daily. 06/05/15   Vassie Loll, MD    Family History    Family History  Problem Relation Age of Onset   Cancer Father    Alzheimer's disease Father    She indicated that her mother is deceased. She indicated that her father is deceased. She indicated that her brother is alive. She indicated that her child is alive. She indicated that her other is alive.  Social History    Social History   Socioeconomic History   Marital status: Married    Spouse name: Not on file   Number of children: Not on file   Years of education: Not on file   Highest education level: Not on file  Occupational History   Not on file  Tobacco Use   Smoking status: Never   Smokeless tobacco: Never  Substance and Sexual Activity   Alcohol use: No    Alcohol/week: 0.0 standard drinks of alcohol   Drug use: No   Sexual activity: Not on file  Other Topics Concern   Not on file  Social History Narrative   Not on file   Social Drivers of Health   Financial Resource Strain: Not on file  Food Insecurity: Not on file  Transportation Needs: Not on file  Physical Activity: Not on file  Stress: Not on file  Social Connections: Not on file  Intimate Partner Violence: Not on file     Review of Systems     General:  No chills, fever, night sweats or weight changes.  Cardiovascular:  No chest pain, dyspnea on exertion, edema, orthopnea, palpitations, paroxysmal nocturnal dyspnea. Dermatological: No rash, lesions/masses Respiratory: No cough, dyspnea Urologic: No hematuria, dysuria Abdominal:   No nausea, vomiting, diarrhea, bright red blood per rectum, melena, or hematemesis Neurologic:  No visual changes, wkns, changes in mental status. All other systems reviewed and are otherwise negative except as noted above.  Physical Exam    VS:  BP 118/86 (BP Location: Right Arm, Patient Position: Sitting)   Pulse (!) 57   Ht 5\' 6"  (1.676 m)   Wt 167 lb (75.8 kg)   SpO2 91%   BMI 26.95 kg/m  , BMI Body mass index is 26.95 kg/m. GEN: Well nourished, well developed, in no acute distress. HEENT: normal. Neck: Supple, no JVD, carotid bruits, or masses. Cardiac: RRR, no murmurs,  rubs, or gallops. No clubbing, cyanosis, edema.  Radials/DP/PT 2+ and equal bilaterally.  Respiratory:  Respirations regular and unlabored, clear to auscultation bilaterally. GI: Soft, nontender, nondistended, BS + x 4. MS: no deformity or atrophy. Skin: warm and dry, no rash. Neuro:  Strength and sensation are intact.  Mood pleasant.  Poor historian. Psych: Normal affect.  Accessory Clinical Findings    Recent Labs: 01/05/2023: BUN 13; Creatinine, Ser 0.96; Hemoglobin 14.7; Platelets 213; Potassium 4.0; Sodium 138   Recent Lipid Panel    Component Value Date/Time   CHOL 203 (H) 06/04/2015 0219   TRIG 68 06/04/2015 0219   HDL 56 06/04/2015 0219   CHOLHDL 3.6 06/04/2015 0219   VLDL 14 06/04/2015 0219   LDLCALC 133 (H) 06/04/2015 0219         ECG personally reviewed by me today- EKG Interpretation Date/Time:  Monday October 08 2023 15:23:27 EST Ventricular Rate:  60 PR Interval:  182 QRS Duration:  82 QT Interval:  396 QTC Calculation: 396 R Axis:   42  Text Interpretation: Normal sinus rhythm  Confirmed by Edd Fabian [phone]) on 10/08/2023 3:59:18 PM    Echocardiogram 06/04/15  Study Conclusions   - Left ventricle: The cavity size was normal. Systolic function was    normal. The estimated ejection fraction was in the range of 60%    to 65%. Wall motion was normal; there were no regional wall    motion abnormalities.  - Atrial septum: No defect or patent foramen ovale was identified.   Transthoracic echocardiography.  M-mode, complete 2D, spectral  Doppler, and color Doppler.  Birthdate:  Patient birthdate:  24-Aug-1945.  Age:  Patient is 78 yr old.  Sex:  Gender: female.  BMI: 28.8 kg/m^2.  Blood pressure:     120/68  Patient status:  Inpatient.  Study date:  Study date: 06/04/2015. Study time: 08:32  AM.  Location:  Bedside.   -------------------------------------------------------------------   -------------------------------------------------------------------  Left ventricle:  The cavity size was normal. Systolic function was  normal. The estimated ejection fraction was in the range of 60% to  65%. Wall motion was normal; there were no regional wall motion  abnormalities.   -------------------------------------------------------------------  Aortic valve:   Trileaflet; normal thickness, mildly calcified  leaflets. Mobility was not restricted.  Doppler:  Transvalvular  velocity was within the normal range. There was no stenosis. There  was no regurgitation.   -------------------------------------------------------------------  Aorta: The aorta was normal, not dilated, and non-diseased. Aortic  root: The aortic root was normal in size.   -------------------------------------------------------------------  Mitral valve:   Structurally normal valve.   Mobility was not  restricted.  Doppler:  Transvalvular velocity was within the normal  range. There was no evidence for stenosis. There was no  regurgitation.    Peak gradient (D): 3 mm Hg.    -------------------------------------------------------------------  Left atrium:  The atrium was normal in size.   -------------------------------------------------------------------  Atrial septum:  No defect or patent foramen ovale was identified.    -------------------------------------------------------------------  Right ventricle:  The cavity size was normal. Wall thickness was  normal. Systolic function was normal.   -------------------------------------------------------------------  Pulmonic valve:    Doppler:  Transvalvular velocity was within the  normal range. There was no evidence for stenosis.   -------------------------------------------------------------------  Tricuspid valve:   Structurally normal valve.    Doppler:  Transvalvular velocity was within the normal range. There was  trivial regurgitation.   -------------------------------------------------------------------  Pulmonary artery:   The main pulmonary artery  was normal-sized.  Systolic pressure was within the normal range.   -------------------------------------------------------------------  Right atrium:  The atrium was normal in size.   -------------------------------------------------------------------  Pericardium: The pericardium was normal in appearance. There was  no pericardial effusion.   -------------------------------------------------------------------  Systemic veins:  Inferior vena cava: The vessel was normal in size.   -------------------------------------------------------------------  Post procedure conclusions  Ascending Aorta:   - The aorta was normal, not dilated, and non-diseased.    Cardiac event monitor 10/24/2021  Dominant rhythm is marked sinus bradycardia with a minimum heart rate of 42 bpm and an average heart rate of 58 bpm.. There appears to be blunted circadian variation. During sinus rhythm, the heart rate rarely approaches 100 bpm. Frequent PACs are seen  (roughly 11% of all beats) there were occasional brief episodes of nonsustained atrial tachycardia, maximum 11 beats. Atrial ectopy and episodes of atrial tachycardia are commonly seen at night. There are no episodes of significant ventricular arrhythmia and no evidence of AV conduction abnormalities.   Abnormal arrhythmia monitor due to the presence of persistent and moderate to severe sinus bradycardia with suggestion of chronotropic incompetence. There are frequent episodes of atrial ectopy and brief nonsustained atrial tachycardia, but no atrial fibrillation is seen. She is no longer receiving any medications with negative chronotropic effect.  She is on levothyroxine supplement, but a recent TSH evaluation is not available for review. If the patient is euthyroid, the findings are most consistent with age-related sinus node dysfunction (sick sinus syndrome). Pacemaker therapy can be considered if the patient has prominent complaints of fatigue/exertional intolerance.   Patch Wear Time:  2 days and 22 hours (2023-03-01T14:18:01-499 to 2023-03-04T12:50:57-0500)   Patient had a min HR of 42 bpm, max HR of 150 bpm, and avg HR of 58 bpm. Predominant underlying rhythm was Sinus Rhythm. 15 Supraventricular Tachycardia runs occurred, the run with the fastest interval lasting 11 beats with a max rate of 150 bpm, the  longest lasting 13.3 secs with an avg rate of 113 bpm. Isolated SVEs were frequent (10.7%, 24549), SVE Couplets were rare (<1.0%, 508), and SVE Triplets were rare (<1.0%, 121). Isolated VEs were rare (<1.0%), and no VE Couplets or VE Triplets were  present.        Assessment & Plan   1.  Chest pain-no chest pain today.  Denies subsequent episodes of chest discomfort.  Denies exertional chest pain.  Does occasionally have episodes of reflux.  Reports compliance with her Prilosec.  Cardiac troponins negative.  EKG in the emergency department showed no ST changes.  Chest discomfort appears  to be related to reflux. Patient reassured Continue Prilosec GERD diet instructions reviewed  Bradycardia-heart rate today 57.  Denies fatigue and weakness.  Denies presyncope or syncope.  Previously felt that she did not need PPM.  Continue to avoid negative chronotropic effect medications: Beta-blockers, calcium channel blockers, clonidine, glaucoma drops.  Hyperlipidemia-LDL 117 on 12/07/21.  Mild atherosclerosis noted on CT for nephrolithiasis.  With no evidence of obstructions on ABI or carotid duplex. Maintain physical activity High-fiber diet  Hypothyroidism-reports compliance with levothyroxine. Follows with PCP  Disposition: Follow-up with Dr. Royann Shivers or me in 6-9 months.   Thomasene Ripple. Falyn Rubel NP-C     10/08/2023, 4:22 PM [facility] [address] Office [phone] Fax [phone]    I spent 14 minutes examining this patient, reviewing medications, and using patient centered shared decision making involving their cardiac care.   I spent  20  minutes reviewing past medical history,  medications, and prior cardiac tests.

## 2023-10-08 ENCOUNTER — Encounter: Payer: Self-pay | Admitting: General Practice

## 2023-10-08 ENCOUNTER — Ambulatory Visit: Payer: Medicare HMO | Attending: General Practice | Admitting: General Practice

## 2023-10-08 VITALS — BP 118/86 | HR 57 | Ht 66.0 in | Wt 167.0 lb

## 2023-10-08 DIAGNOSIS — Z136 Encounter for screening for cardiovascular disorders: Secondary | ICD-10-CM

## 2023-10-08 DIAGNOSIS — E782 Mixed hyperlipidemia: Secondary | ICD-10-CM

## 2023-10-08 DIAGNOSIS — R0789 Other chest pain: Secondary | ICD-10-CM

## 2023-10-08 DIAGNOSIS — R001 Bradycardia, unspecified: Secondary | ICD-10-CM

## 2023-10-08 NOTE — Patient Instructions (Signed)
 Medication Instructions:  The current medical regimen is effective;  continue present plan and medications as directed. Please refer to the Current Medication list given to you today.  *If you need a refill on your cardiac medications before your next appointment, please call your pharmacy*  Lab Work: NONE   Other Instructions INCREASE PHYSICAL ACTIVITY AS TOLERATED PLEASE READ AND FOLLOW ATTACHED  SALTY 6 HAVE HUSBAND'S PRIMARY MD REFER TO CARDIOLOGY  Follow-Up: At [facility], you and your health needs are our priority.  As part of our continuing mission to provide you with exceptional heart care, we have created designated Provider Care Teams.  These Care Teams include your primary Cardiologist (physician) and Advanced Practice Providers (APPs -  Physician Assistants and Nurse Practitioners) who all work together to provide you with the care you need, when you need it.  Your next appointment:   6 month(s)  Provider:   Thurmon Fair, MD  or Edd Fabian, FNP

## 2023-10-10 DIAGNOSIS — E538 Deficiency of other specified B group vitamins: Secondary | ICD-10-CM | POA: Diagnosis not present

## 2023-10-17 DIAGNOSIS — F5104 Psychophysiologic insomnia: Secondary | ICD-10-CM | POA: Diagnosis not present

## 2023-10-17 DIAGNOSIS — F0390 Unspecified dementia without behavioral disturbance: Secondary | ICD-10-CM | POA: Diagnosis not present

## 2023-10-17 DIAGNOSIS — Z6827 Body mass index (BMI) 27.0-27.9, adult: Secondary | ICD-10-CM | POA: Diagnosis not present

## 2023-10-17 DIAGNOSIS — R2681 Unsteadiness on feet: Secondary | ICD-10-CM | POA: Diagnosis not present

## 2023-10-17 DIAGNOSIS — Z0000 Encounter for general adult medical examination without abnormal findings: Secondary | ICD-10-CM | POA: Diagnosis not present

## 2023-10-17 DIAGNOSIS — Z0001 Encounter for general adult medical examination with abnormal findings: Secondary | ICD-10-CM | POA: Diagnosis not present

## 2023-10-17 DIAGNOSIS — I10 Essential (primary) hypertension: Secondary | ICD-10-CM | POA: Diagnosis not present

## 2023-10-17 DIAGNOSIS — Z1389 Encounter for screening for other disorder: Secondary | ICD-10-CM | POA: Diagnosis not present

## 2023-10-17 DIAGNOSIS — Z1331 Encounter for screening for depression: Secondary | ICD-10-CM | POA: Diagnosis not present

## 2023-10-30 DIAGNOSIS — H2513 Age-related nuclear cataract, bilateral: Secondary | ICD-10-CM | POA: Diagnosis not present

## 2023-10-30 DIAGNOSIS — H16223 Keratoconjunctivitis sicca, not specified as Sjogren's, bilateral: Secondary | ICD-10-CM | POA: Diagnosis not present

## 2023-10-30 DIAGNOSIS — H43813 Vitreous degeneration, bilateral: Secondary | ICD-10-CM | POA: Diagnosis not present

## 2024-01-14 DIAGNOSIS — E039 Hypothyroidism, unspecified: Secondary | ICD-10-CM | POA: Diagnosis not present

## 2024-01-14 DIAGNOSIS — R5383 Other fatigue: Secondary | ICD-10-CM | POA: Diagnosis not present

## 2024-01-16 NOTE — H&P (Signed)
 Surgical History & Physical  Patient Name: [name] [name]  DOB: [birth year]  Surgery: Cataract extraction with intraocular lens implant phacoemulsification; Left Eye Surgeon: Ardeth Krabbe MD Surgery Date: 01/25/2024 Pre-Op Date: 10/30/2023  HPI: A 66 Yr. old female patient 1. The patient is here for cataract evaluation -OU. Pt. complains of difficulty when viewing TV, reading closed caption, news scrolls on TV. Both eyes are affected. The episode is constant. Symptoms occur when the patient is driving and reading. The complaint is associated with blurry vision. Pt. has noticed vision has decreased within the last year. *Pt. has memory loss*  Medical History:  Memory problems Thyroid  Problems  Review of Systems Endocrine Hyperthyroidism  Social Never smoked  Medication Ibuprofen , Omeprazole , Atorvastatin, Cyclobenzaprine , Levothyroxine , Memantine   Sx/Procedures Hysterectomy  Drug Allergies  Augmentin, Sulfa (Sulfonamide Antibiotics)  History & Physical: Heent: cataracts NECK: supple without bruits LUNGS: lungs clear to auscultation CV: regular rate and rhythm Abdomen: soft and non-tender  Impression & Plan: Assessment: 1.  CATARACT NUCLEAR SCLEROSIS AGE RELATED; Both Eyes (H25.13) 2.  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  Cataracts are visually significant and account for the patient's complaints. Discussed all risks, benefits, procedures and recovery, including infection, loss of vision and eye, need for glasses after surgery or additional procedures. Patient understands changing glasses will not improve vision. Patient indicated understanding of procedure. All questions answered. Patient desires to have surgery, recommend phacoemulsification with intraocular lens. Patient to have preliminary testing necessary ([employer]/IOL Master, Mac OCT, TOPO) Educational  materials provided:Cataract.  Plan: - Proceed with cataract surgery OS followed by OD - Plan for best distance target with DIB00 - No DM, no fuchs, no prior eye surgery - good dilation - dextenza  2.  Dry eye. Mild signs of dry eye at this time. Can use artificial tears QID OU PRN and warm compresses once daily as needed.  3.  Old. Symptomatic. Patient advised to call ASAP if vision decreases or symptoms increase.

## 2024-01-18 DIAGNOSIS — R030 Elevated blood-pressure reading, without diagnosis of hypertension: Secondary | ICD-10-CM | POA: Diagnosis not present

## 2024-01-18 DIAGNOSIS — Z6828 Body mass index (BMI) 28.0-28.9, adult: Secondary | ICD-10-CM | POA: Diagnosis not present

## 2024-01-18 DIAGNOSIS — E663 Overweight: Secondary | ICD-10-CM | POA: Diagnosis not present

## 2024-01-18 DIAGNOSIS — J22 Unspecified acute lower respiratory infection: Secondary | ICD-10-CM | POA: Diagnosis not present

## 2024-01-18 DIAGNOSIS — R059 Cough, unspecified: Secondary | ICD-10-CM | POA: Diagnosis not present

## 2024-01-19 DIAGNOSIS — I10 Essential (primary) hypertension: Secondary | ICD-10-CM | POA: Diagnosis not present

## 2024-01-19 DIAGNOSIS — R630 Anorexia: Secondary | ICD-10-CM | POA: Diagnosis not present

## 2024-01-19 DIAGNOSIS — R52 Pain, unspecified: Secondary | ICD-10-CM | POA: Diagnosis not present

## 2024-01-19 DIAGNOSIS — R059 Cough, unspecified: Secondary | ICD-10-CM | POA: Diagnosis not present

## 2024-01-19 DIAGNOSIS — J069 Acute upper respiratory infection, unspecified: Secondary | ICD-10-CM | POA: Diagnosis not present

## 2024-01-19 DIAGNOSIS — Z881 Allergy status to other antibiotic agents status: Secondary | ICD-10-CM | POA: Diagnosis not present

## 2024-01-19 DIAGNOSIS — Z79890 Hormone replacement therapy (postmenopausal): Secondary | ICD-10-CM | POA: Diagnosis not present

## 2024-01-19 DIAGNOSIS — Z20822 Contact with and (suspected) exposure to covid-19: Secondary | ICD-10-CM | POA: Diagnosis not present

## 2024-01-19 DIAGNOSIS — R0981 Nasal congestion: Secondary | ICD-10-CM | POA: Diagnosis not present

## 2024-01-19 DIAGNOSIS — K219 Gastro-esophageal reflux disease without esophagitis: Secondary | ICD-10-CM | POA: Diagnosis not present

## 2024-01-19 DIAGNOSIS — J929 Pleural plaque without asbestos: Secondary | ICD-10-CM | POA: Diagnosis not present

## 2024-01-23 ENCOUNTER — Encounter (HOSPITAL_COMMUNITY)
Admission: RE | Admit: 2024-01-23 | Discharge: 2024-01-23 | Disposition: A | Discharge status: Home / Self Care | Source: Ambulatory Visit | Attending: Optometry | Admitting: Optometry

## 2024-01-23 NOTE — Pre-Procedure Instructions (Signed)
Attempted pre-op phone call. Left Vm for her to call us back.

## 2024-01-25 ENCOUNTER — Ambulatory Visit (HOSPITAL_COMMUNITY): Admission: RE | Admit: 2024-01-25 | Source: Home / Self Care | Admitting: Optometry

## 2024-01-25 ENCOUNTER — Encounter (HOSPITAL_COMMUNITY): Payer: Self-pay | Admitting: Certified Registered Nurse Anesthetist

## 2024-01-25 ENCOUNTER — Encounter (HOSPITAL_COMMUNITY): Admission: RE | Payer: Self-pay | Source: Home / Self Care

## 2024-01-25 SURGERY — PHACOEMULSIFICATION, CATARACT, WITH IOL INSERTION
Anesthesia: Monitor Anesthesia Care | Laterality: Left

## 2024-01-25 NOTE — OR Nursing (Signed)
 Attempted again to contact patient ,  unsuccessful.

## 2024-02-04 ENCOUNTER — Encounter (HOSPITAL_COMMUNITY)

## 2024-02-08 ENCOUNTER — Ambulatory Visit (HOSPITAL_COMMUNITY): Admission: RE | Admit: 2024-02-08 | Source: Home / Self Care | Admitting: Optometry

## 2024-02-08 ENCOUNTER — Encounter (HOSPITAL_COMMUNITY): Admission: RE | Payer: Self-pay | Source: Home / Self Care

## 2024-02-08 SURGERY — PHACOEMULSIFICATION, CATARACT, WITH IOL INSERTION
Anesthesia: Monitor Anesthesia Care | Laterality: Right

## 2024-02-22 DIAGNOSIS — K5730 Diverticulosis of large intestine without perforation or abscess without bleeding: Secondary | ICD-10-CM | POA: Diagnosis not present

## 2024-02-22 DIAGNOSIS — M545 Low back pain, unspecified: Secondary | ICD-10-CM | POA: Diagnosis not present

## 2024-02-22 DIAGNOSIS — M5136 Other intervertebral disc degeneration, lumbar region with discogenic back pain only: Secondary | ICD-10-CM | POA: Diagnosis not present

## 2024-02-22 DIAGNOSIS — G8929 Other chronic pain: Secondary | ICD-10-CM | POA: Diagnosis not present

## 2024-02-22 DIAGNOSIS — K8020 Calculus of gallbladder without cholecystitis without obstruction: Secondary | ICD-10-CM | POA: Diagnosis not present

## 2024-02-22 DIAGNOSIS — I10 Essential (primary) hypertension: Secondary | ICD-10-CM | POA: Diagnosis not present

## 2024-02-22 DIAGNOSIS — Z79890 Hormone replacement therapy (postmenopausal): Secondary | ICD-10-CM | POA: Diagnosis not present

## 2024-02-22 DIAGNOSIS — K219 Gastro-esophageal reflux disease without esophagitis: Secondary | ICD-10-CM | POA: Diagnosis not present

## 2024-02-28 DIAGNOSIS — M545 Low back pain, unspecified: Secondary | ICD-10-CM | POA: Diagnosis not present

## 2024-02-28 DIAGNOSIS — Z6828 Body mass index (BMI) 28.0-28.9, adult: Secondary | ICD-10-CM | POA: Diagnosis not present

## 2024-03-01 DIAGNOSIS — M545 Low back pain, unspecified: Secondary | ICD-10-CM | POA: Diagnosis not present

## 2024-03-01 DIAGNOSIS — I10 Essential (primary) hypertension: Secondary | ICD-10-CM | POA: Diagnosis not present

## 2024-03-01 DIAGNOSIS — R109 Unspecified abdominal pain: Secondary | ICD-10-CM | POA: Diagnosis not present

## 2024-03-13 DIAGNOSIS — M79604 Pain in right leg: Secondary | ICD-10-CM | POA: Diagnosis not present

## 2024-03-13 DIAGNOSIS — M545 Low back pain, unspecified: Secondary | ICD-10-CM | POA: Diagnosis not present

## 2024-03-13 DIAGNOSIS — Z6827 Body mass index (BMI) 27.0-27.9, adult: Secondary | ICD-10-CM | POA: Diagnosis not present

## 2024-03-13 DIAGNOSIS — N1831 Chronic kidney disease, stage 3a: Secondary | ICD-10-CM | POA: Diagnosis not present

## 2024-03-13 DIAGNOSIS — R32 Unspecified urinary incontinence: Secondary | ICD-10-CM | POA: Diagnosis not present

## 2024-03-20 ENCOUNTER — Telehealth: Payer: Self-pay | Admitting: Adult Health

## 2024-03-20 NOTE — Telephone Encounter (Signed)
 MYC conf

## 2024-03-24 NOTE — Telephone Encounter (Signed)
 Patient's daughter, Nathanel Blush said cancelling appointment because patient not feeling well and not going to be able to come to appointment Yesterday stayed balled up in the chair. Ms. Blush stated, she could not tell me what hurting just said body does not feel well.

## 2024-03-24 NOTE — Telephone Encounter (Signed)
 Noted patient seems to be on books for an apt in march 2026

## 2024-03-25 ENCOUNTER — Ambulatory Visit: Payer: Medicare HMO | Admitting: Adult Health

## 2024-10-13 ENCOUNTER — Ambulatory Visit: Admitting: Adult Health
